# Patient Record
Sex: Male | Born: 1960 | Race: White | Hispanic: No | Marital: Married | Smoking: Former smoker
Health system: Southern US, Community
[De-identification: ages and names within clinical notes are randomized; demographics above are authoritative.]

## PROBLEM LIST (undated history)

## (undated) DIAGNOSIS — I341 Nonrheumatic mitral (valve) prolapse: Secondary | ICD-10-CM

## (undated) DIAGNOSIS — C801 Malignant (primary) neoplasm, unspecified: Secondary | ICD-10-CM

## (undated) DIAGNOSIS — G709 Myoneural disorder, unspecified: Secondary | ICD-10-CM

## (undated) DIAGNOSIS — R52 Pain, unspecified: Secondary | ICD-10-CM

## (undated) DIAGNOSIS — K219 Gastro-esophageal reflux disease without esophagitis: Secondary | ICD-10-CM

## (undated) DIAGNOSIS — M1990 Unspecified osteoarthritis, unspecified site: Secondary | ICD-10-CM

## (undated) HISTORY — DX: Nonrheumatic mitral (valve) prolapse: I34.1

## (undated) HISTORY — PX: WISDOM TOOTH EXTRACTION: SHX21

## (undated) HISTORY — PX: POLYPECTOMY: SHX149

## (undated) HISTORY — DX: Gastro-esophageal reflux disease without esophagitis: K21.9

## (undated) HISTORY — DX: Myoneural disorder, unspecified: G70.9

## (undated) HISTORY — PX: OTHER SURGICAL HISTORY: SHX169

## (undated) HISTORY — DX: Unspecified osteoarthritis, unspecified site: M19.90

## (undated) HISTORY — PX: COLONOSCOPY: SHX174

---

## 1989-08-05 HISTORY — PX: CYST REMOVAL TRUNK: SHX6283

## 2002-01-02 ENCOUNTER — Emergency Department (HOSPITAL_COMMUNITY): Admission: EM | Admit: 2002-01-02 | Discharge: 2002-01-02 | Payer: Self-pay | Admitting: *Deleted

## 2014-02-16 ENCOUNTER — Encounter: Payer: Self-pay | Admitting: Gastroenterology

## 2014-03-22 ENCOUNTER — Ambulatory Visit (AMBULATORY_SURGERY_CENTER): Payer: 59 | Admitting: *Deleted

## 2014-03-22 VITALS — Ht 78.0 in | Wt 235.0 lb

## 2014-03-22 DIAGNOSIS — Z1211 Encounter for screening for malignant neoplasm of colon: Secondary | ICD-10-CM

## 2014-03-22 MED ORDER — MOVIPREP 100 G PO SOLR: ORAL | Status: DC

## 2014-03-22 NOTE — Progress Notes (Signed)
No allergies to eggs or soy. No prior anesthesia.  Pt given Emmi instructions for colonoscopy  No oxygen use  No diet drug use  

## 2014-04-19 ENCOUNTER — Ambulatory Visit (AMBULATORY_SURGERY_CENTER): Payer: 59 | Admitting: Gastroenterology

## 2014-04-19 ENCOUNTER — Encounter: Payer: Self-pay | Admitting: Gastroenterology

## 2014-04-19 VITALS — BP 131/92 | HR 78 | Temp 97.7°F | Resp 21 | Ht 78.0 in | Wt 234.0 lb

## 2014-04-19 DIAGNOSIS — Z1211 Encounter for screening for malignant neoplasm of colon: Secondary | ICD-10-CM

## 2014-04-19 DIAGNOSIS — D126 Benign neoplasm of colon, unspecified: Secondary | ICD-10-CM

## 2014-04-19 DIAGNOSIS — C20 Malignant neoplasm of rectum: Secondary | ICD-10-CM

## 2014-04-19 MED ORDER — SODIUM CHLORIDE 0.9 % IV SOLN: 500.0000 mL | INTRAVENOUS | Status: DC

## 2014-04-19 NOTE — Progress Notes (Signed)
A/ox3 pleased with MAC, report to April RN 

## 2014-04-19 NOTE — Op Note (Signed)
[place]  [address]. [place], [address]   COLONOSCOPY PROCEDURE REPORT  PATIENT: [name] [name], [name] [name]  MR#: [id number] BIRTHDATE: May 06, 1961 , 39  yrs. old GENDER: Male ENDOSCOPIST: Milus Banister, MD REFERRED AL:[id number] Inda Merlin, M.D. PROCEDURE DATE:  04/19/2014 PROCEDURE:   Colonoscopy with snare polypectomy First Screening Colonoscopy - Avg.  risk and is 50 yrs.  old or older Yes.  Prior Negative Screening - Now for repeat screening. N/A  History of Adenoma - Now for follow-up colonoscopy & has been > or = to 3 yrs.  N/A  Polyps Removed Today? Yes. ASA CLASS:   Class II INDICATIONS:average risk screening. MEDICATIONS: MAC sedation, administered by CRNA and propofol (Diprivan) 350mg  IV  DESCRIPTION OF PROCEDURE:   After the risks benefits and alternatives of the procedure were thoroughly explained, informed consent was obtained.  A digital rectal exam revealed no abnormalities of the rectum.   The LB [id number]  endoscope was introduced through the anus and advanced to the cecum, which was identified by both the appendix and ileocecal valve. No adverse events experienced.   The quality of the prep was excellent.  The instrument was then slowly withdrawn as the colon was fully examined.  COLON FINDINGS: Two polyps were found, removed and sent to pathology.  One was sessile, located in ascending segment, 36mm across, removed with cold snare (jar 1).  One was semipedunculated, 1.5cm across, located in proximal rectum, removed with snare/cautery (jar 2).  The examination was otherwise normal. Retroflexed views revealed no abnormalities. The time to cecum=4 minutes 06 seconds.  Withdrawal time=11 minutes 32 seconds.  The scope was withdrawn and the procedure completed. COMPLICATIONS: There were no complications.  ENDOSCOPIC IMPRESSION: Two polyps were found, removed and sent to pathology. The examination was otherwise normal.  RECOMMENDATIONS: If the  polyp(s) removed today are proven to be adenomatous (pre-cancerous) polyps, you will need a colonoscopy in 3 years. Otherwise you should continue to follow colorectal cancer screening guidelines for "routine risk" patients with a colonoscopy in 10 years.  You will receive a letter within 1-2 weeks with the results of your biopsy as well as final recommendations.  Please call my office if you have not received a letter after 3 weeks.   eSigned:  Milus Banister, MD 04/19/2014 8:47 AM

## 2014-04-19 NOTE — Patient Instructions (Signed)
YOU HAD AN ENDOSCOPIC PROCEDURE TODAY AT THE [facility]: Refer to the procedure report that was given to you for any specific questions about what was found during the examination.  If the procedure report does not answer your questions, please call your gastroenterologist to clarify.  If you requested that your care partner not be given the details of your procedure findings, then the procedure report has been included in a sealed envelope for you to review at your convenience later.  YOU SHOULD EXPECT: Some feelings of bloating in the abdomen. Passage of more gas than usual.  Walking can help get rid of the air that was put into your GI tract during the procedure and reduce the bloating. If you had a lower endoscopy (such as a colonoscopy or flexible sigmoidoscopy) you may notice spotting of blood in your stool or on the toilet paper. If you underwent a bowel prep for your procedure, then you may not have a normal bowel movement for a few days.  DIET: Your first meal following the procedure should be a light meal and then it is ok to progress to your normal diet.  A half-sandwich or bowl of soup is an example of a good first meal.  Heavy or fried foods are harder to digest and may make you feel nauseous or bloated.  Likewise meals heavy in dairy and vegetables can cause extra gas to form and this can also increase the bloating.  Drink plenty of fluids but you should avoid alcoholic beverages for 24 hours.  ACTIVITY: Your care partner should take you home directly after the procedure.  You should plan to take it easy, moving slowly for the rest of the day.  You can resume normal activity the day after the procedure however you should NOT DRIVE or use heavy machinery for 24 hours (because of the sedation medicines used during the test).    SYMPTOMS TO REPORT IMMEDIATELY: A gastroenterologist can be reached at any hour.  During normal business hours, 8:30 AM to 5:00 PM Monday through Friday,  call [phone].  After hours and on weekends, please call the GI answering service at [phone] who will take a message and have the physician on call contact you.   Following lower endoscopy (colonoscopy or flexible sigmoidoscopy):  Excessive amounts of blood in the stool  Significant tenderness or worsening of abdominal pains  Swelling of the abdomen that is new, acute  Fever of 100F or higher  FOLLOW UP: If any biopsies were taken you will be contacted by phone or by letter within the next 1-3 weeks.  Call your gastroenterologist if you have not heard about the biopsies in 3 weeks.  Our staff will call the home number listed on your records the next business day following your procedure to check on you and address any questions or concerns that you may have at that time regarding the information given to you following your procedure. This is a courtesy call and so if there is no answer at the home number and we have not heard from you through the emergency physician on call, we will assume that you have returned to your regular daily activities without incident.  SIGNATURES/CONFIDENTIALITY: You and/or your care partner have signed paperwork which will be entered into your electronic medical record.  These signatures attest to the fact that that the information above on your After Visit Summary has been reviewed and is understood.  Full responsibility of the confidentiality of this   discharge information lies with you and/or your care-partner.  Polyps-handout given  Repeat colonoscopy will be determined by pathology   

## 2014-04-20 ENCOUNTER — Telehealth: Payer: Self-pay | Admitting: *Deleted

## 2014-04-20 NOTE — Telephone Encounter (Signed)
  Follow up Call-  Call back number 04/19/2014  Post procedure Call Back phone  # [phone]  Permission to leave phone message Yes     Patient questions:  Do you have a fever, pain , or abdominal swelling? No. Pain Score  0 *  Have you tolerated food without any problems? Yes.    Have you been able to return to your normal activities? Yes.    Do you have any questions about your discharge instructions: Diet   No. Medications  No. Follow up visit  No.  Do you have questions or concerns about your Care? No.  Actions: * If pain score is 4 or above: No action needed, pain <4.

## 2014-04-26 ENCOUNTER — Telehealth: Payer: Self-pay | Admitting: Gastroenterology

## 2014-04-26 DIAGNOSIS — C189 Malignant neoplasm of colon, unspecified: Secondary | ICD-10-CM

## 2014-04-26 NOTE — Telephone Encounter (Signed)
[name] [name], I spoke with Dr. Lyndon [name] about the proximal rectum polyp.  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  I discussed with [name] [name].  He needs 1. Flex sig to tatoo the site (next Wednesday at [facility]) 2. CT scan chest, abd, pelvis with IV and po contrast for colon cancer staging 3. CEA level 4. Referral to [facility] surgery Dr. Leighton [name], I'm sending this man your way, screening exam. Polyp with cancer to the margin of cautery. I located in proximal rectum.  Pending staging above I was not planning on EUS unless you prefer for that to be done. The site was small, if any remains in proximal rectum it is microscopic.

## 2014-04-27 NOTE — Telephone Encounter (Addendum)
You have been scheduled for a CT scan of the chest, abdomen and pelvis at [facility] ([address]---this is in the same building as [facility]).   You are scheduled on 04/29/14 at 1 pm. You should arrive 15 minutes prior to your appointment time for registration. Please follow the written instructions below on the day of your exam:  WARNING: IF YOU ARE ALLERGIC TO IODINE/X-RAY DYE, PLEASE NOTIFY RADIOLOGY IMMEDIATELY AT [phone]! YOU WILL BE GIVEN A 13 HOUR PREMEDICATION PREP.  1) Do not eat or drink anything after 9 am (4 hours prior to your test) 2) You have been given 2 bottles of oral contrast to drink. The solution may taste better if refrigerated, but do NOT add ice or any other liquid to this solution. Shake well before drinking.    Drink 1 bottle of contrast @ 11 am (2 hours prior to your exam)  Drink 1 bottle of contrast @ 12 noon (1 hour prior to your exam)  You may take any medications as prescribed with a small amount of water except for the following: Metformin, Glucophage, Glucovance, Avandamet, Riomet, Fortamet, Actoplus Met, Janumet, Glumetza or Metaglip. The above medications must be held the day of the exam AND 48 hours after the exam.  The purpose of you drinking the oral contrast is to aid in the visualization of your intestinal tract. The contrast solution may cause some diarrhea. Before your exam is started, you will be given a small amount of fluid to drink. Depending on your individual set of symptoms, you may also receive an intravenous injection of x-ray contrast/dye. Plan on being at [facility] for 30 minutes or long, depending on the type of exam you are having performed.  This test typically takes 30-45 minutes to complete.  If you have any questions regarding your exam or if you need to reschedule, you may call the CT department at [phone] between the hours of 8:00 am and 5:00 pm,  Monday-Friday.  ________________________________________________________________________ The pt has been scheduled for Flex on 05/04/14  The pt needs to come in on Thursday to sign paperwork and pick up contrast and instructions as well as have labs. The lab order was entered into EPIC You will be contacted by [facility] with appt date and time.  Referral was sent.  Appt is with Dr Marcello Moores on 05/02/14 955 am arrive at 925 am  Left message on machine to call back

## 2014-04-27 NOTE — Telephone Encounter (Signed)
The pt is aware and will come in tomorrow and get instructions, labs and contrast

## 2014-04-28 ENCOUNTER — Other Ambulatory Visit: Payer: 59

## 2014-04-28 DIAGNOSIS — C189 Malignant neoplasm of colon, unspecified: Secondary | ICD-10-CM

## 2014-04-29 ENCOUNTER — Ambulatory Visit (INDEPENDENT_AMBULATORY_CARE_PROVIDER_SITE_OTHER)
Admission: RE | Admit: 2014-04-29 | Discharge: 2014-04-29 | Disposition: A | Discharge status: Home / Self Care | Payer: 59 | Source: Ambulatory Visit | Attending: Gastroenterology | Admitting: Gastroenterology

## 2014-04-29 DIAGNOSIS — C189 Malignant neoplasm of colon, unspecified: Secondary | ICD-10-CM

## 2014-04-29 LAB — CEA: CEA: 0.6 ng/mL (ref 0.0–5.0)

## 2014-04-29 MED ORDER — IOHEXOL 300 MG/ML  SOLN
100.0000 mL | Freq: Once | INTRAMUSCULAR | Status: AC | PRN
  Administered 2014-04-29: 100 mL via INTRAVENOUS

## 2014-05-02 ENCOUNTER — Other Ambulatory Visit (INDEPENDENT_AMBULATORY_CARE_PROVIDER_SITE_OTHER): Payer: Self-pay | Admitting: General Surgery

## 2014-05-02 NOTE — H&P (Signed)
[name] [name]. [name] [name] 05/02/2014 10:03 AM Location: [facility] Patient #: [id number] DOB: 1961/06/14 Married / Language: [name] [name] / Race: White Male History of Present Illness [name] Ruff MD; 0/98/1191 10:50 AM) Patient words: eval for colon ca.  The patient is a 53 year old male who presents with a complaint of rectal cancer. patient presents to the office with a rectal cancer found in a proximal rectal polyp during screening colonoscopy. Polyp was excised with snare cautery and cancer was noted at the base of the cauterized margin. A CT scan of the ic diseasemen and pelvis show no signs of metastatic disease. CEA was 0.6. Patient has a family history of colon polyps but not colon cancer. He has noticed a small amount of rectal bleeding with irregular bowel habits. he denies any weight loss. Other Problems [name] [name], CMA; 05/02/2014 10:04 AM) Back Pain Gastroesophageal Reflux Disease  Past Surgical History [name] [name], CMA; 05/02/2014 10:04 AM) Colon Polyp Removal - Colonoscopy  Diagnostic Studies History [name] [name], CMA; 05/02/2014 10:04 AM) Colonoscopy within last year  Allergies [name] [name], CMA; 05/02/2014 10:05 AM) Penicillamine *ASSORTED CLASSES*  Medication History ([name] [name], CMA; 05/02/2014 10:06 AM) Ranitidine HCl (150MG  Tablet, Oral daily) Active. Vitamin D (1000UNIT Tablet, Oral two times daily) Active.  Social History [name] [name], [place]; 05/02/2014 10:04 AM) Alcohol use Occasional alcohol use. Caffeine use Coffee. No drug use Tobacco use Never smoker.  Family History [name] [name], [place]; 05/02/2014 10:04 AM) Colon Polyps Mother. Hypertension Father, Mother. Migraine Headache Mother. Thyroid problems Mother.     Review of Systems [name] [name] CMA; 05/02/2014 10:04 AM) General Not Present- Appetite Loss, Chills, Fatigue, Fever, Night Sweats, Weight Gain and Weight Loss. Skin Not Present- Change in Wart/Mole, Dryness, Hives, Jaundice, New  Lesions, Non-Healing Wounds, Rash and Ulcer. HEENT Present- Seasonal Allergies and Wears glasses/contact lenses. Not Present- Earache, Hearing Loss, Hoarseness, [name] Bleed, Oral Ulcers, Ringing in the Ears, Sinus Pain, Sore Throat, Visual Disturbances and Yellow Eyes. Respiratory Not Present- Bloody sputum, Chronic Cough, Difficulty Breathing, Snoring and Wheezing. Breast Not Present- Breast Mass, Breast Pain, Nipple Discharge and Skin Changes. Cardiovascular Not Present- Chest Pain, Difficulty Breathing Lying Down, Leg Cramps, Palpitations, Rapid Heart Rate, Shortness of Breath and Swelling of Extremities. Gastrointestinal Present- Change in Bowel Habits. Not Present- Abdominal Pain, Bloating, Bloody Stool, Chronic diarrhea, Constipation, Difficulty Swallowing, Excessive gas, Gets full quickly at meals, Hemorrhoids, Indigestion, Nausea, Rectal Pain and Vomiting. Male Genitourinary Not Present- Blood in Urine, Change in Urinary Stream, Frequency, Impotence, Nocturia, Painful Urination, Urgency and Urine Leakage. Musculoskeletal Not Present- Back Pain, Joint Pain, Joint Stiffness, Muscle Pain, Muscle Weakness and Swelling of Extremities. Neurological Not Present- Decreased Memory, Fainting, Headaches, Numbness, Seizures, Tingling, Tremor, Trouble walking and Weakness. Psychiatric Not Present- Anxiety, Bipolar, Change in Sleep Pattern, Depression, Fearful and Frequent crying. Endocrine Not Present- Cold Intolerance, Excessive Hunger, Hair Changes, Heat Intolerance, Hot flashes and New Diabetes. Hematology Not Present- Easy Bruising, Excessive bleeding, Gland problems, HIV and Persistent Infections.  Vitals ([name] [name] CMA; 05/02/2014 10:08 AM) 05/02/2014 10:07 AM Weight: 229 lb Height: 78in Body Surface Area: 2.39 m Body Mass Index: 26.46 kg/m Pulse: 100 (Regular)  BP: 136/96 (Sitting, Left Arm, Standard)     Physical Exam [name] Ruff MD; 4/78/2956 11:45 AM)  General Mental  Status-Alert. General Appearance-Cooperative.  Chest and Lung Exam Auscultation Breath sounds - Normal.  Cardiovascular Cardiovascular examination reveals -normal heart sounds, regular rate and rhythm with no murmurs.  Abdomen Palpation/Percussion Palpation and Percussion of the abdomen  reveal - Soft and Non Tender.    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 Cancer was noted down to the cauterized edge. Metastatic workup has been negative. Impression: We had a long discussion today about surgical resection versus endoscopic surveillance. We discussed the risk of endoscopic surveillance in detail. This includes a small risk of lymph node involvement as well as recurrence of cancer. He will undergo reevaluation and tattooing of the lesion on Wednesday. We will also discuss his case and cancer conference on Wednesday morning. Given the information that I have, I have recommended surgical resection with a low anterior resection and primary anastomosis. I believe this would be standard of care in his case. He will discuss this with his wife and Dr. Ardis Hughs again on Wednesday. We will call him in a few days to see if he would like to schedule surgery. The surgery and anatomy were described to the patient as well as the risks of surgery and the possible complications. These include: Bleeding, deep abdominal infections and possible wound complications such as hernia and infection, damage to adjacent structures, leak of surgical connections, which can lead to other surgeries and possibly an ostomy, possible need for other procedures, such as abscess drains in radiology, possible prolonged hospital stay, possible diarrhea from removal of part of the colon, possible constipation from narcotics, possible bowel,  bladder or sexual dysfunction if having rectal surgery, prolonged fatigue/weakness or appetite loss, possible early recurrence of of disease, possible complications of their medical problems such as heart disease or arrhythmias or lung problems, death (less than 1%). I believe the patient understands and wishes to proceed with the surgery.  Current Plans Pt Education - [facility] Bowel Prep Pt Education - [facility] Colorectal Cancer (AT) Started Neomycin Sulfate 500MG , 2 (two) Tablet SEE NOTE, #6, 05/02/2014, No Refill. Local Order: TAKE TWO TABLETS AT 2 PM, 3 PM, AND 10 PM THE DAY PRIOR TO SURGERY Started Flagyl 500MG , 2 (two) Tablet SEE NOTE, #6, 05/02/2014, No Refill. Local Order: Take at 2pm, 3pm, and 10pm the day prior to your colon operation

## 2014-05-04 ENCOUNTER — Encounter: Payer: Self-pay | Admitting: Gastroenterology

## 2014-05-04 ENCOUNTER — Ambulatory Visit (AMBULATORY_SURGERY_CENTER): Payer: 59 | Admitting: Gastroenterology

## 2014-05-04 VITALS — BP 128/93 | HR 72 | Temp 95.4°F | Resp 22 | Ht 78.0 in | Wt 234.0 lb

## 2014-05-04 DIAGNOSIS — D126 Benign neoplasm of colon, unspecified: Secondary | ICD-10-CM

## 2014-05-04 DIAGNOSIS — C189 Malignant neoplasm of colon, unspecified: Secondary | ICD-10-CM

## 2014-05-04 MED ORDER — SODIUM CHLORIDE 0.9 % IV SOLN: 500.0000 mL | INTRAVENOUS | Status: DC

## 2014-05-04 NOTE — Progress Notes (Signed)
Called to room to assist during endoscopic procedure.  Patient ID and intended procedure confirmed with present staff. Received instructions for my participation in the procedure from the performing physician.  

## 2014-05-04 NOTE — Progress Notes (Signed)
Report to PACU, RN, vss, BBS= Clear.  

## 2014-05-04 NOTE — Patient Instructions (Signed)
Discharge instructions given with verbal understanding. Biopsies taken. Resume previous medications. YOU HAD AN ENDOSCOPIC PROCEDURE TODAY AT THE [facility]: Refer to the procedure report that was given to you for any specific questions about what was found during the examination.  If the procedure report does not answer your questions, please call your gastroenterologist to clarify.  If you requested that your care partner not be given the details of your procedure findings, then the procedure report has been included in a sealed envelope for you to review at your convenience later.  YOU SHOULD EXPECT: Some feelings of bloating in the abdomen. Passage of more gas than usual.  Walking can help get rid of the air that was put into your GI tract during the procedure and reduce the bloating. If you had a lower endoscopy (such as a colonoscopy or flexible sigmoidoscopy) you may notice spotting of blood in your stool or on the toilet paper. If you underwent a bowel prep for your procedure, then you may not have a normal bowel movement for a few days.  DIET: Your first meal following the procedure should be a light meal and then it is ok to progress to your normal diet.  A half-sandwich or bowl of soup is an example of a good first meal.  Heavy or fried foods are harder to digest and may make you feel nauseous or bloated.  Likewise meals heavy in dairy and vegetables can cause extra gas to form and this can also increase the bloating.  Drink plenty of fluids but you should avoid alcoholic beverages for 24 hours.  ACTIVITY: Your care partner should take you home directly after the procedure.  You should plan to take it easy, moving slowly for the rest of the day.  You can resume normal activity the day after the procedure however you should NOT DRIVE or use heavy machinery for 24 hours (because of the sedation medicines used during the test).    SYMPTOMS TO REPORT IMMEDIATELY: A gastroenterologist  can be reached at any hour.  During normal business hours, 8:30 AM to 5:00 PM Monday through Friday, call [phone].  After hours and on weekends, please call the GI answering service at [phone] who will take a message and have the physician on call contact you.   Following lower endoscopy (colonoscopy or flexible sigmoidoscopy):  Excessive amounts of blood in the stool  Significant tenderness or worsening of abdominal pains  Swelling of the abdomen that is new, acute  Fever of 100F or higher  FOLLOW UP: If any biopsies were taken you will be contacted by phone or by letter within the next 1-3 weeks.  Call your gastroenterologist if you have not heard about the biopsies in 3 weeks.  Our staff will call the home number listed on your records the next business day following your procedure to check on you and address any questions or concerns that you may have at that time regarding the information given to you following your procedure. This is a courtesy call and so if there is no answer at the home number and we have not heard from you through the emergency physician on call, we will assume that you have returned to your regular daily activities without incident.  SIGNATURES/CONFIDENTIALITY: You and/or your care partner have signed paperwork which will be entered into your electronic medical record.  These signatures attest to the fact that that the information above on your After Visit Summary has been reviewed   and is understood.  Full responsibility of the confidentiality of this discharge information lies with you and/or your care-partner.  

## 2014-05-04 NOTE — Op Note (Signed)
[place]  [address] [place], [address]   FLEX SIGMOIDOSCOPY PROCEDURE REPORT  PATIENT: [name], [name]  MR#: [id number] BIRTHDATE: 04-Dec-1960 , 44  yrs. 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 MEDICATIONS: Monitored anesthesia care and Propofol 200 mg IV  DESCRIPTION OF PROCEDURE:    Physical exam was performed.  Informed consent was obtained from the patient after explaining the benefits, risks, and alternatives to procedure.  The patient was connected to monitor and placed in left lateral position. Continuous oxygen was provided by nasal cannula and IV medicine administered through an indwelling cannula.  After administration of sedation and rectal exam, the patients rectum was intubated and the LB [id number]  colonoscope was advanced under direct visualization to the cecum.  The scope was removed slowly by carefully examining the color, texture, anatomy, and integrity mucosa on the way out.  The patient was recovered in endoscopy and discharged home in satisfactory condition.   COLON FINDINGS: The site of previous "proximal rectum" polyp was located.  This was a 13cm from anal verge (rectosigmoid junction). There was a typical healing ulcer with surrounding slightly edematous mucosa.  The mucosa directly adjacent to the ulcer was biopsied and then 2 submucosal injections of [place] Ink were performed.  The examination was otherwise normal. PREP QUALITY: The overall prep quality was excellent. CECAL W/D TIME: NA  COMPLICATIONS: None  ENDOSCOPIC IMPRESSION: The site of previous "proximal rectum" polyp was located.  This was a 13cm from anal verge (rectosigmoid junction).  There was a typical healing ulcer with surrounding slightly edematous mucosa. The mucosa  directly adjacent to the ulcer was biopsied and then 2 submucosal injections of [place] Ink were performed.  The examination was otherwise normal  RECOMMENDATIONS: Proceed with surgical resection as planned by Dr. Marcello Moores.   _______________________________ eSigned:  Milus Banister, MD 24/23/5361 4:43 AM   cc: Leighton Ruff, MD

## 2014-05-05 ENCOUNTER — Telehealth: Payer: Self-pay | Admitting: *Deleted

## 2014-05-05 NOTE — Telephone Encounter (Signed)
  Follow up Call-  Call back number 05/04/2014 04/19/2014  Post procedure Call Back phone  # [phone]  Permission to leave phone message Yes Yes     Patient questions:  Do you have a fever, pain , or abdominal swelling? No. Pain Score  0 *  Have you tolerated food without any problems? Yes.    Have you been able to return to your normal activities? Yes.    Do you have any questions about your discharge instructions: Diet   No. Medications  No. Follow up visit  No.  Do you have questions or concerns about your Care? No.  Actions: * If pain score is 4 or above: No action needed, pain <4.

## 2014-05-06 ENCOUNTER — Telehealth: Payer: Self-pay | Admitting: Gastroenterology

## 2014-05-06 NOTE — Telephone Encounter (Signed)
I have left message for the patient to call back 

## 2014-05-09 NOTE — Telephone Encounter (Signed)
The pt is aware that Ibuprofen is not a medication that we hold prior to procedures.

## 2014-05-18 ENCOUNTER — Encounter (HOSPITAL_COMMUNITY): Payer: Self-pay | Admitting: Pharmacy Technician

## 2014-05-23 ENCOUNTER — Encounter (HOSPITAL_COMMUNITY)
Admission: RE | Admit: 2014-05-23 | Discharge: 2014-05-23 | Disposition: A | Discharge status: Home / Self Care | Payer: 59 | Source: Ambulatory Visit | Attending: General Surgery | Admitting: General Surgery

## 2014-05-23 ENCOUNTER — Encounter (HOSPITAL_COMMUNITY): Payer: Self-pay

## 2014-05-23 DIAGNOSIS — C20 Malignant neoplasm of rectum: Secondary | ICD-10-CM | POA: Insufficient documentation

## 2014-05-23 DIAGNOSIS — Z01818 Encounter for other preprocedural examination: Secondary | ICD-10-CM | POA: Diagnosis present

## 2014-05-23 HISTORY — DX: Malignant (primary) neoplasm, unspecified: C80.1

## 2014-05-23 HISTORY — DX: Pain, unspecified: R52

## 2014-05-23 LAB — BASIC METABOLIC PANEL
Anion gap: 13 (ref 5–15)
BUN: 10 mg/dL (ref 6–23)
CO2: 24 meq/L (ref 19–32)
Calcium: 9.7 mg/dL (ref 8.4–10.5)
Chloride: 104 mEq/L (ref 96–112)
Creatinine, Ser: 0.78 mg/dL (ref 0.50–1.35)
GFR calc Af Amer: >90 mL/min (ref >90)
GFR calc non Af Amer: >90 mL/min (ref >90)
GLUCOSE: 98 mg/dL (ref 70–99)
Potassium: 3.9 mEq/L (ref 3.7–5.3)
SODIUM: 141 meq/L (ref 137–147)

## 2014-05-23 LAB — CBC
HEMATOCRIT: 42 % (ref 39.0–52.0)
HEMOGLOBIN: 14.7 g/dL (ref 13.0–17.0)
MCH: 30.2 pg (ref 26.0–34.0)
MCHC: 35 g/dL (ref 30.0–36.0)
MCV: 86.4 fL (ref 78.0–100.0)
Platelets: 214 10*3/uL (ref 150–400)
RBC: 4.86 MIL/uL (ref 4.22–5.81)
RDW: 12.2 % (ref 11.5–15.5)
WBC: 6.4 10*3/uL (ref 4.0–10.5)

## 2014-05-23 LAB — HEMOGLOBIN A1C
HEMOGLOBIN A1C: 5.5 % (ref <5.7)
Mean Plasma Glucose: 111 mg/dL (ref <117)

## 2014-05-23 NOTE — Pre-Procedure Instructions (Signed)
PT HAS CHEST CT REPORT IN EPIC FROM 04/29/14. EKG NOT NEEDED PER ANESTHESIOLOGIST'S GUIDELINES.

## 2014-05-23 NOTE — Progress Notes (Signed)
05/23/14 1323  OBSTRUCTIVE SLEEP APNEA  Have you ever been diagnosed with sleep apnea through a sleep study? No  Do you snore loudly (loud enough to be heard through closed doors)?  1  Do you often feel tired, fatigued, or sleepy during the daytime? 0  Has anyone observed you stop breathing during your sleep? 0  Do you have, or are you being treated for high blood pressure? 0  BMI more than 35 kg/m2? 0  Age over 54 years old? 1  Neck circumference greater than 40 cm/16 inches? 1  Gender: 1  Obstructive Sleep Apnea Score 4  Score 4 or greater  Results sent to PCP

## 2014-05-23 NOTE — Patient Instructions (Signed)
Follow your bowel prep instructions from Dr. Florene Route SURGERY IS SCHEDULED AT [facility]  ON:    Friday  10/23  REPORT TO  SHORT STAY CENTER AT:  10:45 AM    DO NOT EAT  ANYTHING AFTER MIDNIGHT THE NIGHT BEFORE YOUR SURGERY.   NO FOOD, NO CHEWING GUM, NO MINTS, NO CANDIES, NO CHEWING TOBACCO. YOU MAY HAVE CLEAR LIQUIDS TO DRINK FROM MIDNIGHT UNTIL 6:45 AM DAY OF YOUR SURGERY - LIKE WATER, COFFEE, GATORADE.  NOTHING TO DRINK AFTER 6:45 AM DAY OF YOUR SURGERY.  PLEASE TAKE THE FOLLOWING MEDICATIONS THE AM OF YOUR SURGERY WITH A FEW SIPS OF WATER:  NO MEDICATIONS TO TAKE   DO NOT BRING VALUABLES, MONEY, CREDIT CARDS.  DO NOT WEAR JEWELRY, MAKE-UP, NAIL POLISH AND NO METAL PINS OR CLIPS IN YOUR HAIR. CONTACT LENS, DENTURES / PARTIALS, GLASSES SHOULD NOT BE WORN TO SURGERY AND IN MOST CASES-HEARING AIDS WILL NEED TO BE REMOVED.  BRING YOUR GLASSES CASE, ANY EQUIPMENT NEEDED FOR YOUR CONTACT LENS. FOR PATIENTS ADMITTED TO THE HOSPITAL--CHECK OUT TIME THE DAY OF DISCHARGE IS 11:00 AM.  ALL INPATIENT ROOMS ARE PRIVATE - WITH BATHROOM, TELEPHONE, TELEVISION AND WIFI INTERNET.    PLEASE BE AWARE THAT YOU MAY NEED ADDITIONAL BLOOD DRAWN DAY OF YOUR SURGERY  _______________________________________________________________________   [facility] - Preparing for Surgery Before surgery, you can play an important role.  Because skin is not sterile, your skin needs to be as free of germs as possible.  You can reduce the number of germs on your skin by washing with CHG (chlorahexidine gluconate) soap before surgery.  CHG is an antiseptic cleaner which kills germs and bonds with the skin to continue killing germs even after washing. Please DO NOT use if you have an allergy to CHG or antibacterial soaps.  If your skin becomes reddened/irritated stop using the CHG and inform your nurse when you arrive at Short Stay. Do not shave (including legs and underarms) for at least 48 hours prior to  the first CHG shower.  You may shave your face/neck. Please follow these instructions carefully:  1.  Shower with CHG Soap the night before surgery and the  morning of Surgery.  2.  If you choose to wash your hair, wash your hair first as usual with your  normal  shampoo.  3.  After you shampoo, rinse your hair and body thoroughly to remove the  shampoo.                           4.  Use CHG as you would any other liquid soap.  You can apply chg directly  to the skin and wash                       Gently with a scrungie or clean washcloth.  5.  Apply the CHG Soap to your body ONLY FROM THE NECK DOWN.   Do not use on face/ open                           Wound or open sores. Avoid contact with eyes, ears mouth and genitals (private parts).                       Wash face,  Genitals (private parts) with your normal soap.  6.  Wash thoroughly, paying special attention to the area where your surgery  will be performed.  7.  Thoroughly rinse your body with warm water from the neck down.  8.  DO NOT shower/wash with your normal soap after using and rinsing off  the CHG Soap.                9.  Pat yourself dry with a clean towel.            10.  Wear clean pajamas.            11.  Place clean sheets on your bed the night of your first shower and do not  sleep with pets. Day of Surgery : Do not apply any lotions/deodorants the morning of surgery.  Please wear clean clothes to the hospital/surgery center.  FAILURE TO FOLLOW THESE INSTRUCTIONS MAY RESULT IN THE CANCELLATION OF YOUR SURGERY PATIENT SIGNATURE_________________________________  NURSE SIGNATURE__________________________________  ________________________________________________________________________

## 2014-05-26 MED ORDER — CLINDAMYCIN PHOSPHATE 900 MG/50ML IV SOLN
900.0000 mg | INTRAVENOUS | Status: AC
  Administered 2014-05-27: 900 mg via INTRAVENOUS

## 2014-05-26 MED ORDER — GENTAMICIN SULFATE 40 MG/ML IJ SOLN
5.0000 mg/kg | INTRAVENOUS | Status: AC
  Administered 2014-05-27: 530 mg via INTRAVENOUS
  Filled 2014-05-26: qty 13.25

## 2014-05-27 ENCOUNTER — Encounter (HOSPITAL_COMMUNITY): Payer: 59 | Admitting: Certified Registered Nurse Anesthetist

## 2014-05-27 ENCOUNTER — Inpatient Hospital Stay (HOSPITAL_COMMUNITY)
Admission: RE | Admit: 2014-05-27 | Discharge: 2014-05-31 | DRG: 334 | Disposition: A | Discharge status: Home / Self Care | Payer: 59 | Source: Ambulatory Visit | Attending: General Surgery | Admitting: General Surgery

## 2014-05-27 ENCOUNTER — Encounter (HOSPITAL_COMMUNITY): Payer: Self-pay | Admitting: General Practice

## 2014-05-27 ENCOUNTER — Inpatient Hospital Stay (HOSPITAL_COMMUNITY): Payer: 59 | Admitting: Certified Registered Nurse Anesthetist

## 2014-05-27 ENCOUNTER — Encounter (HOSPITAL_COMMUNITY)
Admission: RE | Disposition: A | Discharge status: Home / Self Care | Payer: Self-pay | Source: Ambulatory Visit | Attending: General Surgery

## 2014-05-27 DIAGNOSIS — Z01812 Encounter for preprocedural laboratory examination: Secondary | ICD-10-CM | POA: Diagnosis not present

## 2014-05-27 DIAGNOSIS — K219 Gastro-esophageal reflux disease without esophagitis: Secondary | ICD-10-CM | POA: Diagnosis present

## 2014-05-27 DIAGNOSIS — Z8249 Family history of ischemic heart disease and other diseases of the circulatory system: Secondary | ICD-10-CM | POA: Diagnosis not present

## 2014-05-27 DIAGNOSIS — Z6826 Body mass index (BMI) 26.0-26.9, adult: Secondary | ICD-10-CM | POA: Diagnosis not present

## 2014-05-27 DIAGNOSIS — C20 Malignant neoplasm of rectum: Principal | ICD-10-CM | POA: Diagnosis present

## 2014-05-27 DIAGNOSIS — Z8371 Family history of colonic polyps: Secondary | ICD-10-CM | POA: Diagnosis not present

## 2014-05-27 LAB — TYPE AND SCREEN
ABO/RH(D): A NEG
ANTIBODY SCREEN: NEGATIVE

## 2014-05-27 LAB — ABO/RH: ABO/RH(D): A NEG

## 2014-05-27 SURGERY — COLECTOMY, PARTIAL, ROBOT-ASSISTED, LAPAROSCOPIC
Anesthesia: General | Site: Abdomen

## 2014-05-27 MED ORDER — BUPIVACAINE-EPINEPHRINE 0.25% -1:200000 IJ SOLN
INTRAMUSCULAR | Status: DC | PRN
  Administered 2014-05-27: 30 mL

## 2014-05-27 MED ORDER — LIDOCAINE HCL (CARDIAC) 20 MG/ML IV SOLN
INTRAVENOUS | Status: AC
  Filled 2014-05-27: qty 5

## 2014-05-27 MED ORDER — HEPARIN SODIUM (PORCINE) 5000 UNIT/ML IJ SOLN
5000.0000 [IU] | Freq: Once | INTRAMUSCULAR | Status: AC
Start: 2014-05-27 — End: 2014-05-27
  Administered 2014-05-27: 5000 [IU] via SUBCUTANEOUS
  Filled 2014-05-27: qty 1

## 2014-05-27 MED ORDER — MORPHINE SULFATE 2 MG/ML IJ SOLN: 2.0000 mg | INTRAMUSCULAR | Status: DC | PRN

## 2014-05-27 MED ORDER — METOCLOPRAMIDE HCL 5 MG/ML IJ SOLN
INTRAMUSCULAR | Status: AC
Start: 2014-05-27 — End: 2014-05-27
  Filled 2014-05-27: qty 2

## 2014-05-27 MED ORDER — DEXAMETHASONE SODIUM PHOSPHATE 10 MG/ML IJ SOLN
INTRAMUSCULAR | Status: DC | PRN
  Administered 2014-05-27: 10 mg via INTRAVENOUS

## 2014-05-27 MED ORDER — BUPIVACAINE-EPINEPHRINE (PF) 0.25% -1:200000 IJ SOLN
INTRAMUSCULAR | Status: AC
  Filled 2014-05-27: qty 30

## 2014-05-27 MED ORDER — DEXAMETHASONE SODIUM PHOSPHATE 10 MG/ML IJ SOLN
INTRAMUSCULAR | Status: AC
  Filled 2014-05-27: qty 1

## 2014-05-27 MED ORDER — ONDANSETRON HCL 4 MG/2ML IJ SOLN
INTRAMUSCULAR | Status: DC | PRN
  Administered 2014-05-27: 4 mg via INTRAVENOUS

## 2014-05-27 MED ORDER — ALVIMOPAN 12 MG PO CAPS
12.0000 mg | ORAL_CAPSULE | Freq: Two times a day (BID) | ORAL | Status: DC
  Administered 2014-05-28 – 2014-05-30 (×5): 12 mg via ORAL
  Filled 2014-05-27 (×6): qty 1

## 2014-05-27 MED ORDER — ALUM & MAG HYDROXIDE-SIMETH 200-200-20 MG/5ML PO SUSP
30.0000 mL | Freq: Four times a day (QID) | ORAL | Status: DC | PRN
  Administered 2014-05-29 – 2014-05-30 (×2): 30 mL via ORAL
  Filled 2014-05-27 (×2): qty 30

## 2014-05-27 MED ORDER — DEXMEDETOMIDINE HCL IN NACL 200 MCG/50ML IV SOLN
0.3000 ug/kg/h | INTRAVENOUS | Status: DC
  Administered 2014-05-27: 0.3 ug/kg/h via INTRAVENOUS
  Filled 2014-05-27: qty 50

## 2014-05-27 MED ORDER — METOCLOPRAMIDE HCL 5 MG/ML IJ SOLN
INTRAMUSCULAR | Status: DC | PRN
  Administered 2014-05-27: 10 mg via INTRAVENOUS

## 2014-05-27 MED ORDER — PHENYLEPHRINE HCL 10 MG/ML IJ SOLN
INTRAMUSCULAR | Status: DC | PRN
  Administered 2014-05-27: 80 ug via INTRAVENOUS
  Administered 2014-05-27: 40 ug via INTRAVENOUS
  Administered 2014-05-27: 80 ug via INTRAVENOUS

## 2014-05-27 MED ORDER — 0.9 % SODIUM CHLORIDE (POUR BTL) OPTIME
TOPICAL | Status: DC | PRN
  Administered 2014-05-27: 3000 mL

## 2014-05-27 MED ORDER — INDOCYANINE GREEN 25 MG IV SOLR
INTRAVENOUS | Status: DC | PRN
  Administered 2014-05-27: 5 mg via INTRAVENOUS

## 2014-05-27 MED ORDER — SUCCINYLCHOLINE CHLORIDE 20 MG/ML IJ SOLN
INTRAMUSCULAR | Status: DC | PRN
  Administered 2014-05-27: 100 mg via INTRAVENOUS

## 2014-05-27 MED ORDER — ALVIMOPAN 12 MG PO CAPS
12.0000 mg | ORAL_CAPSULE | Freq: Once | ORAL | Status: AC
  Administered 2014-05-27: 12 mg via ORAL
  Filled 2014-05-27: qty 1

## 2014-05-27 MED ORDER — PROPOFOL 10 MG/ML IV BOLUS
INTRAVENOUS | Status: DC | PRN
  Administered 2014-05-27: 200 mg via INTRAVENOUS

## 2014-05-27 MED ORDER — PROPOFOL 10 MG/ML IV BOLUS
INTRAVENOUS | Status: AC
  Filled 2014-05-27: qty 20

## 2014-05-27 MED ORDER — DIPHENHYDRAMINE HCL 50 MG/ML IJ SOLN: 25.0000 mg | Freq: Four times a day (QID) | INTRAMUSCULAR | Status: DC | PRN

## 2014-05-27 MED ORDER — ROCURONIUM BROMIDE 100 MG/10ML IV SOLN
INTRAVENOUS | Status: AC
  Filled 2014-05-27: qty 1

## 2014-05-27 MED ORDER — METOCLOPRAMIDE HCL 5 MG/ML IJ SOLN: 10.0000 mg | Freq: Once | INTRAMUSCULAR | Status: DC

## 2014-05-27 MED ORDER — HYDROMORPHONE HCL 1 MG/ML IJ SOLN
0.2500 mg | INTRAMUSCULAR | Status: DC | PRN
  Administered 2014-05-27 (×4): 0.5 mg via INTRAVENOUS

## 2014-05-27 MED ORDER — ONDANSETRON HCL 4 MG PO TABS: 4.0000 mg | ORAL_TABLET | Freq: Four times a day (QID) | ORAL | Status: DC | PRN

## 2014-05-27 MED ORDER — LACTATED RINGERS IV SOLN
INTRAVENOUS | Status: DC
  Administered 2014-05-27: 15:00:00 via INTRAVENOUS
  Administered 2014-05-27: 1000 mL via INTRAVENOUS
  Administered 2014-05-27: 18:00:00 via INTRAVENOUS

## 2014-05-27 MED ORDER — HYDROMORPHONE HCL 1 MG/ML IJ SOLN
INTRAMUSCULAR | Status: AC
  Filled 2014-05-27: qty 1

## 2014-05-27 MED ORDER — KETOROLAC TROMETHAMINE 15 MG/ML IJ SOLN
15.0000 mg | Freq: Four times a day (QID) | INTRAMUSCULAR | Status: AC
  Administered 2014-05-27 – 2014-05-29 (×8): 15 mg via INTRAVENOUS
  Filled 2014-05-27 (×9): qty 1

## 2014-05-27 MED ORDER — SODIUM CHLORIDE 0.9 % IV SOLN
40.0000 mg | Freq: Once | INTRAVENOUS | Status: AC
  Administered 2014-05-27: 40 mg via INTRAVENOUS
  Filled 2014-05-27 (×2): qty 4

## 2014-05-27 MED ORDER — CLINDAMYCIN PHOSPHATE 900 MG/50ML IV SOLN
900.0000 mg | Freq: Three times a day (TID) | INTRAVENOUS | Status: AC
  Administered 2014-05-27: 900 mg via INTRAVENOUS
  Filled 2014-05-27: qty 50

## 2014-05-27 MED ORDER — FENTANYL CITRATE 0.05 MG/ML IJ SOLN
INTRAMUSCULAR | Status: DC | PRN
  Administered 2014-05-27 (×2): 25 ug via INTRAVENOUS
  Administered 2014-05-27: 100 ug via INTRAVENOUS
  Administered 2014-05-27 (×2): 50 ug via INTRAVENOUS

## 2014-05-27 MED ORDER — CLINDAMYCIN PHOSPHATE 900 MG/50ML IV SOLN
INTRAVENOUS | Status: AC
  Filled 2014-05-27: qty 50

## 2014-05-27 MED ORDER — GLYCOPYRROLATE 0.2 MG/ML IJ SOLN
INTRAMUSCULAR | Status: AC
  Filled 2014-05-27: qty 4

## 2014-05-27 MED ORDER — MIDAZOLAM HCL 5 MG/5ML IJ SOLN
INTRAMUSCULAR | Status: DC | PRN
  Administered 2014-05-27: 2 mg via INTRAVENOUS

## 2014-05-27 MED ORDER — MIDAZOLAM HCL 2 MG/2ML IJ SOLN
INTRAMUSCULAR | Status: AC
  Filled 2014-05-27: qty 2

## 2014-05-27 MED ORDER — ZOLPIDEM TARTRATE 5 MG PO TABS: 5.0000 mg | ORAL_TABLET | Freq: Every evening | ORAL | Status: DC | PRN

## 2014-05-27 MED ORDER — ONDANSETRON HCL 4 MG/2ML IJ SOLN
INTRAMUSCULAR | Status: AC
  Filled 2014-05-27: qty 2

## 2014-05-27 MED ORDER — ONDANSETRON HCL 4 MG/2ML IJ SOLN: 4.0000 mg | Freq: Four times a day (QID) | INTRAMUSCULAR | Status: DC | PRN

## 2014-05-27 MED ORDER — SODIUM CHLORIDE 0.9 % IV SOLN
INTRAVENOUS | Status: DC
  Administered 2014-05-28 – 2014-05-29 (×4): via INTRAVENOUS

## 2014-05-27 MED ORDER — FAMOTIDINE 20 MG PO TABS: 40.0000 mg | ORAL_TABLET | Freq: Once | ORAL | Status: DC

## 2014-05-27 MED ORDER — PROMETHAZINE HCL 25 MG/ML IJ SOLN: 6.2500 mg | INTRAMUSCULAR | Status: DC | PRN

## 2014-05-27 MED ORDER — ACETAMINOPHEN 500 MG PO TABS
1000.0000 mg | ORAL_TABLET | Freq: Four times a day (QID) | ORAL | Status: AC
  Administered 2014-05-27 – 2014-05-28 (×4): 1000 mg via ORAL
  Filled 2014-05-27 (×5): qty 2

## 2014-05-27 MED ORDER — PHENYLEPHRINE 40 MCG/ML (10ML) SYRINGE FOR IV PUSH (FOR BLOOD PRESSURE SUPPORT)
PREFILLED_SYRINGE | INTRAVENOUS | Status: AC
  Filled 2014-05-27: qty 10

## 2014-05-27 MED ORDER — DIPHENHYDRAMINE HCL 25 MG PO CAPS: 25.0000 mg | ORAL_CAPSULE | Freq: Four times a day (QID) | ORAL | Status: DC | PRN

## 2014-05-27 MED ORDER — KETOROLAC TROMETHAMINE 15 MG/ML IJ SOLN
15.0000 mg | Freq: Four times a day (QID) | INTRAMUSCULAR | Status: DC | PRN
  Administered 2014-05-30: 15 mg via INTRAVENOUS
  Filled 2014-05-27: qty 1

## 2014-05-27 MED ORDER — FAMOTIDINE 20 MG PO TABS
20.0000 mg | ORAL_TABLET | Freq: Every day | ORAL | Status: DC
  Administered 2014-05-27 – 2014-05-30 (×4): 20 mg via ORAL
  Filled 2014-05-27 (×5): qty 1

## 2014-05-27 MED ORDER — NEOSTIGMINE METHYLSULFATE 10 MG/10ML IV SOLN
INTRAVENOUS | Status: DC | PRN
  Administered 2014-05-27: 5 mg via INTRAVENOUS

## 2014-05-27 MED ORDER — LACTATED RINGERS IR SOLN
Status: DC | PRN
  Administered 2014-05-27: 1000 mL

## 2014-05-27 MED ORDER — GLYCOPYRROLATE 0.2 MG/ML IJ SOLN
INTRAMUSCULAR | Status: DC | PRN
  Administered 2014-05-27: .8 mg via INTRAVENOUS

## 2014-05-27 MED ORDER — LACTATED RINGERS IV SOLN
INTRAVENOUS | Status: DC
  Administered 2014-05-27: 1000 mL via INTRAVENOUS
  Administered 2014-05-27: 17:00:00 via INTRAVENOUS

## 2014-05-27 MED ORDER — ENOXAPARIN SODIUM 40 MG/0.4ML ~~LOC~~ SOLN
40.0000 mg | SUBCUTANEOUS | Status: DC
  Administered 2014-05-28 – 2014-05-30 (×3): 40 mg via SUBCUTANEOUS
  Filled 2014-05-27 (×5): qty 0.4

## 2014-05-27 MED ORDER — FENTANYL CITRATE 0.05 MG/ML IJ SOLN
INTRAMUSCULAR | Status: AC
Start: 2014-05-27 — End: 2014-05-27
  Filled 2014-05-27: qty 5

## 2014-05-27 MED ORDER — ROCURONIUM BROMIDE 100 MG/10ML IV SOLN
INTRAVENOUS | Status: DC | PRN
  Administered 2014-05-27 (×2): 15 mg via INTRAVENOUS
  Administered 2014-05-27: 30 mg via INTRAVENOUS
  Administered 2014-05-27: 40 mg via INTRAVENOUS
  Administered 2014-05-27: 20 mg via INTRAVENOUS

## 2014-05-27 MED ORDER — NEOSTIGMINE METHYLSULFATE 10 MG/10ML IV SOLN
INTRAVENOUS | Status: AC
  Filled 2014-05-27: qty 1

## 2014-05-27 SURGICAL SUPPLY — 99 items
BLADE EXTENDED COATED 6.5IN (ELECTRODE) ×3 IMPLANT
BLADE SURG SZ11 CARB STEEL (BLADE) ×3 IMPLANT
CANNULA REDUC XI 12-8 STAPL (CANNULA) ×1
CANNULA REDUC XI 12-8MM STAPL (CANNULA) ×1
CANNULA REDUCER 12-8 DVNC XI (CANNULA) ×1 IMPLANT
CELLS DAT CNTRL 66122 CELL SVR (MISCELLANEOUS) IMPLANT
CHLORAPREP W/TINT 26ML (MISCELLANEOUS) ×3 IMPLANT
CLIP LIGATING HEM O LOK PURPLE (MISCELLANEOUS) IMPLANT
CLIP LIGATING HEMOLOK MED (MISCELLANEOUS) IMPLANT
COVER MAYO STAND STRL (DRAPES) ×3 IMPLANT
COVER TIP SHEARS 8 DVNC (MISCELLANEOUS) ×1 IMPLANT
COVER TIP SHEARS 8MM DA VINCI (MISCELLANEOUS) ×2
DECANTER SPIKE VIAL GLASS SM (MISCELLANEOUS) ×3 IMPLANT
DEVICE TROCAR PUNCTURE CLOSURE (ENDOMECHANICALS) ×2 IMPLANT
DRAIN CHANNEL 19F RND (DRAIN) ×2 IMPLANT
DRAPE ARM DVNC X/XI (DISPOSABLE) ×4 IMPLANT
DRAPE COLUMN DVNC XI (DISPOSABLE) ×1 IMPLANT
DRAPE DA VINCI XI ARM (DISPOSABLE) ×8
DRAPE DA VINCI XI COLUMN (DISPOSABLE) ×2
DRAPE SHEET LG 3/4 BI-LAMINATE (DRAPES) ×3 IMPLANT
DRAPE SPLIT 77X100IN (DRAPES) ×2 IMPLANT
DRAPE SURG IRRIG POUCH 19X23 (DRAPES) ×3 IMPLANT
DRAPE WARM FLUID 44X44 (DRAPE) ×3 IMPLANT
DRSG OPSITE POSTOP 4X10 (GAUZE/BANDAGES/DRESSINGS) IMPLANT
DRSG OPSITE POSTOP 4X6 (GAUZE/BANDAGES/DRESSINGS) ×2 IMPLANT
DRSG OPSITE POSTOP 4X8 (GAUZE/BANDAGES/DRESSINGS) IMPLANT
ELECT PENCIL ROCKER SW 15FT (MISCELLANEOUS) ×6 IMPLANT
ELECT REM PT RETURN 15FT ADLT (MISCELLANEOUS) ×3 IMPLANT
ENDOLOOP SUT PDS II  0 18 (SUTURE)
ENDOLOOP SUT PDS II 0 18 (SUTURE) IMPLANT
EVACUATOR DRAINAGE 10X20 100CC (DRAIN) IMPLANT
EVACUATOR SILICONE 100CC (DRAIN) ×3 IMPLANT
GAUZE SPONGE 4X4 12PLY STRL (GAUZE/BANDAGES/DRESSINGS) IMPLANT
GLOVE BIO SURGEON STRL SZ 6.5 (GLOVE) ×6 IMPLANT
GLOVE BIO SURGEONS STRL SZ 6.5 (GLOVE) ×3
GLOVE BIOGEL PI IND STRL 7.0 (GLOVE) ×3 IMPLANT
GLOVE BIOGEL PI INDICATOR 7.0 (GLOVE) ×6
GOWN STRL REUS W/TWL 2XL LVL3 (GOWN DISPOSABLE) ×9 IMPLANT
GOWN STRL REUS W/TWL XL LVL3 (GOWN DISPOSABLE) ×16 IMPLANT
HOLDER FOLEY CATH W/STRAP (MISCELLANEOUS) ×3 IMPLANT
KIT BASIN OR (CUSTOM PROCEDURE TRAY) ×6 IMPLANT
LEGGING LITHOTOMY PAIR STRL (DRAPES) ×3 IMPLANT
NDL INSUFFLATION 14GA 120MM (NEEDLE) ×1 IMPLANT
NEEDLE HYPO 22GX1.5 SAFETY (NEEDLE) ×3 IMPLANT
NEEDLE INSUFFLATION 14GA 120MM (NEEDLE) ×3 IMPLANT
PACK CARDIOVASCULAR III (CUSTOM PROCEDURE TRAY) ×3 IMPLANT
PACK GENERAL/GYN (CUSTOM PROCEDURE TRAY) ×3 IMPLANT
PORT LAP GEL ALEXIS MED 5-9CM (MISCELLANEOUS) IMPLANT
RELOAD STAPLE 45 BLU REG DVNC (STAPLE) IMPLANT
RELOAD STAPLE 45 GRN THCK DVNC (STAPLE) IMPLANT
RETRACTOR WND ALEXIS 18 MED (MISCELLANEOUS) IMPLANT
RTRCTR WOUND ALEXIS 18CM MED (MISCELLANEOUS)
SCISSORS LAP 5X45 EPIX DISP (ENDOMECHANICALS) ×3 IMPLANT
SEAL CANN UNIV 5-8 DVNC XI (MISCELLANEOUS) ×3 IMPLANT
SEAL XI 5MM-8MM UNIVERSAL (MISCELLANEOUS) ×6
SEALER VESSEL DA VINCI XI (MISCELLANEOUS) ×2
SEALER VESSEL EXT DVNC XI (MISCELLANEOUS) ×1 IMPLANT
SET IRRIG TUBING LAPAROSCOPIC (IRRIGATION / IRRIGATOR) ×3 IMPLANT
SLEEVE XCEL OPT CAN 5 100 (ENDOMECHANICALS) IMPLANT
SOLUTION ELECTROLUBE (MISCELLANEOUS) ×3 IMPLANT
SPONGE LAP 18X18 X RAY DECT (DISPOSABLE) IMPLANT
STAPLER 45 BLU RELOAD XI (STAPLE) ×3 IMPLANT
STAPLER 45 BLUE RELOAD XI (STAPLE) ×6
STAPLER 45 GREEN RELOAD XI (STAPLE)
STAPLER 45 GRN RELOAD XI (STAPLE) IMPLANT
STAPLER CANNULA SEAL DVNC XI (STAPLE) IMPLANT
STAPLER CANNULA SEAL XI (STAPLE)
STAPLER CIRC ILS CVD 33MM 37CM (STAPLE) ×2 IMPLANT
STAPLER SHEATH (SHEATH) ×2
STAPLER SHEATH ENDOWRIST DVNC (SHEATH) IMPLANT
STAPLER VISISTAT 35W (STAPLE) ×3 IMPLANT
SUCTION POOLE TIP (SUCTIONS) ×3 IMPLANT
SUT ETHILON 3 0 PS 1 (SUTURE) ×2 IMPLANT
SUT PDS AB 1 CTX 36 (SUTURE) ×4 IMPLANT
SUT PDS AB 1 TP1 96 (SUTURE) IMPLANT
SUT PROLENE 2 0 KS (SUTURE) ×5 IMPLANT
SUT SILK 2 0 (SUTURE) ×3
SUT SILK 2 0 SH CR/8 (SUTURE) ×3 IMPLANT
SUT SILK 2-0 18XBRD TIE 12 (SUTURE) ×1 IMPLANT
SUT SILK 3 0 (SUTURE) ×3
SUT SILK 3 0 SH CR/8 (SUTURE) ×3 IMPLANT
SUT SILK 3-0 18XBRD TIE 12 (SUTURE) ×1 IMPLANT
SUT VIC AB 2-0 SH 18 (SUTURE) ×2 IMPLANT
SUT VIC AB 2-0 SH 27 (SUTURE) ×3
SUT VIC AB 2-0 SH 27X BRD (SUTURE) IMPLANT
SUT VIC AB 4-0 PS2 27 (SUTURE) ×4 IMPLANT
SUT VICRYL 0 UR6 27IN ABS (SUTURE) ×2 IMPLANT
SYR 20CC LL (SYRINGE) ×3 IMPLANT
SYRINGE 10CC LL (SYRINGE) ×3 IMPLANT
SYS LAPSCP GELPORT 120MM (MISCELLANEOUS)
SYSTEM LAPSCP GELPORT 120MM (MISCELLANEOUS) IMPLANT
TOWEL OR 17X26 10 PK STRL BLUE (TOWEL DISPOSABLE) ×6 IMPLANT
TOWEL OR NON WOVEN STRL DISP B (DISPOSABLE) ×3 IMPLANT
TRAY FOLEY CATH 14FRSI W/METER (CATHETERS) ×3 IMPLANT
TROCAR BLADELESS OPT 5 100 (ENDOMECHANICALS) ×3 IMPLANT
TUBING CONNECTING 10 (TUBING) IMPLANT
TUBING CONNECTING 10' (TUBING)
TUBING FILTER THERMOFLATOR (ELECTROSURGICAL) ×3 IMPLANT
YANKAUER SUCT BULB TIP 10FT TU (MISCELLANEOUS) ×3 IMPLANT

## 2014-05-27 NOTE — H&P (View-Only) (Signed)
[name] [name]. Viverette 05/02/2014 10:03 AM Location: [facility] Patient #: [id number] DOB: 21-May-1961 Married / Language: Cleophus Molt / Race: White Male History of Present Illness Leighton Ruff MD; 6/96/2952 10:50 AM) Patient words: eval for colon ca.  The patient is a 53 year old male who presents with a complaint of rectal cancer. patient presents to the office with a rectal cancer found in a proximal rectal polyp during screening colonoscopy. Polyp was excised with snare cautery and cancer was noted at the base of the cauterized margin. A CT scan of the ic diseasemen and pelvis show no signs of metastatic disease. CEA was 0.6. Patient has a family history of colon polyps but not colon cancer. He has noticed a small amount of rectal bleeding with irregular bowel habits. he denies any weight loss. Other Problems Davy Pique Bynum, CMA; 05/02/2014 10:04 AM) Back Pain Gastroesophageal Reflux Disease  Past Surgical History Marjean Donna, CMA; 05/02/2014 10:04 AM) Colon Polyp Removal - Colonoscopy  Diagnostic Studies History Marjean Donna, CMA; 05/02/2014 10:04 AM) Colonoscopy within last year  Allergies Davy Pique Bynum, CMA; 05/02/2014 10:05 AM) Penicillamine *ASSORTED CLASSES*  Medication History (Sonya Bynum, CMA; 05/02/2014 10:06 AM) Ranitidine HCl (150MG  Tablet, Oral daily) Active. Vitamin D (1000UNIT Tablet, Oral two times daily) Active.  Social History Marjean Donna, [place]; 05/02/2014 10:04 AM) Alcohol use Occasional alcohol use. Caffeine use Coffee. No drug use Tobacco use Never smoker.  Family History Marjean Donna, [place]; 05/02/2014 10:04 AM) Colon Polyps Mother. Hypertension Father, Mother. Migraine Headache Mother. Thyroid problems Mother.     Review of Systems Davy Pique Bynum CMA; 05/02/2014 10:04 AM) General Not Present- Appetite Loss, Chills, Fatigue, Fever, Night Sweats, Weight Gain and Weight Loss. Skin Not Present- Change in Wart/Mole, Dryness, Hives, Jaundice, New  Lesions, Non-Healing Wounds, Rash and Ulcer. HEENT Present- Seasonal Allergies and Wears glasses/contact lenses. Not Present- Earache, Hearing Loss, Hoarseness, [name] Bleed, Oral Ulcers, Ringing in the Ears, Sinus Pain, Sore Throat, Visual Disturbances and Yellow Eyes. Respiratory Not Present- Bloody sputum, Chronic Cough, Difficulty Breathing, Snoring and Wheezing. Breast Not Present- Breast Mass, Breast Pain, Nipple Discharge and Skin Changes. Cardiovascular Not Present- Chest Pain, Difficulty Breathing Lying Down, Leg Cramps, Palpitations, Rapid Heart Rate, Shortness of Breath and Swelling of Extremities. Gastrointestinal Present- Change in Bowel Habits. Not Present- Abdominal Pain, Bloating, Bloody Stool, Chronic diarrhea, Constipation, Difficulty Swallowing, Excessive gas, Gets full quickly at meals, Hemorrhoids, Indigestion, Nausea, Rectal Pain and Vomiting. Male Genitourinary Not Present- Blood in Urine, Change in Urinary Stream, Frequency, Impotence, Nocturia, Painful Urination, Urgency and Urine Leakage. Musculoskeletal Not Present- Back Pain, Joint Pain, Joint Stiffness, Muscle Pain, Muscle Weakness and Swelling of Extremities. Neurological Not Present- Decreased Memory, Fainting, Headaches, Numbness, Seizures, Tingling, Tremor, Trouble walking and Weakness. Psychiatric Not Present- Anxiety, Bipolar, Change in Sleep Pattern, Depression, Fearful and Frequent crying. Endocrine Not Present- Cold Intolerance, Excessive Hunger, Hair Changes, Heat Intolerance, Hot flashes and New Diabetes. Hematology Not Present- Easy Bruising, Excessive bleeding, Gland problems, HIV and Persistent Infections.  Vitals (Sonya Bynum CMA; 05/02/2014 10:08 AM) 05/02/2014 10:07 AM Weight: 229 lb Height: 78in Body Surface Area: 2.39 m Body Mass Index: 26.46 kg/m Pulse: 100 (Regular)  BP: 136/96 (Sitting, Left Arm, Standard)     Physical Exam Leighton Ruff MD; 8/41/3244 11:45 AM)  General Mental  Status-Alert. General Appearance-Cooperative.  Chest and Lung Exam Auscultation Breath sounds - Normal.  Cardiovascular Cardiovascular examination reveals -normal heart sounds, regular rate and rhythm with no murmurs.  Abdomen Palpation/Percussion Palpation and Percussion of the abdomen  reveal - Soft and Non Tender.    Assessment & Plan Leighton Ruff MD; 3/74/8270 12:23 PM)  PRIMARY CANCER OF RECTUM (154.1  C20) Story: Jionni Helming is a 53 year old male who presents to the office with rectal cancer in a polyp resected during screening colonoscopy. Cancer was noted down to the cauterized edge. Metastatic workup has been negative. Impression: We had a long discussion today about surgical resection versus endoscopic surveillance. We discussed the risk of endoscopic surveillance in detail. This includes a small risk of lymph node involvement as well as recurrence of cancer. He will undergo reevaluation and tattooing of the lesion on Wednesday. We will also discuss his case and cancer conference on Wednesday morning. Given the information that I have, I have recommended surgical resection with a low anterior resection and primary anastomosis. I believe this would be standard of care in his case. He will discuss this with his wife and Dr. Ardis Hughs again on Wednesday. We will call him in a few days to see if he would like to schedule surgery. The surgery and anatomy were described to the patient as well as the risks of surgery and the possible complications. These include: Bleeding, deep abdominal infections and possible wound complications such as hernia and infection, damage to adjacent structures, leak of surgical connections, which can lead to other surgeries and possibly an ostomy, possible need for other procedures, such as abscess drains in radiology, possible prolonged hospital stay, possible diarrhea from removal of part of the colon, possible constipation from narcotics, possible bowel,  bladder or sexual dysfunction if having rectal surgery, prolonged fatigue/weakness or appetite loss, possible early recurrence of of disease, possible complications of their medical problems such as heart disease or arrhythmias or lung problems, death (less than 1%). I believe the patient understands and wishes to proceed with the surgery.  Current Plans Pt Education - [facility] Bowel Prep Pt Education - [facility] Colorectal Cancer (AT) Started Neomycin Sulfate 500MG , 2 (two) Tablet SEE NOTE, #6, 05/02/2014, No Refill. Local Order: TAKE TWO TABLETS AT 2 PM, 3 PM, AND 10 PM THE DAY PRIOR TO SURGERY Started Flagyl 500MG , 2 (two) Tablet SEE NOTE, #6, 05/02/2014, No Refill. Local Order: Take at 2pm, 3pm, and 10pm the day prior to your colon operation

## 2014-05-27 NOTE — Transfer of Care (Signed)
Immediate Anesthesia Transfer of Care Note  Patient: [name] [name]  Procedure(s) Performed: Procedure(s) (LRB): XI ROBOTIC LAPRASCOPIC LOW ANTERIOR RESECTION/LAR (N/A)  Patient Location: PACU  Anesthesia Type: General  Level of Consciousness: sedated, patient cooperative and responds to stimulation  Airway & Oxygen Therapy: Patient Spontanous Breathing and Patient connected to face mask oxgen  Post-op Assessment: Report given to PACU RN and Post -op Vital signs reviewed and stable  Post vital signs: Reviewed and stable  Complications: No apparent anesthesia complications

## 2014-05-27 NOTE — Interval H&P Note (Signed)
History and Physical Interval Note:  05/27/2014 1:30 PM  [name] [name]  has presented today for surgery, with the diagnosis of rectal cancer  The various methods of treatment have been discussed with the patient and family. After consideration of risks, benefits and other options for treatment, the patient has consented to  Procedure(s): XI ROBOT LOWER ANTERIOR RESECTION/LAR (N/A) as a surgical intervention .  The patient's history has been reviewed, patient examined, no change in status, stable for surgery.  I have reviewed the patient's chart and labs.  Questions were answered to the patient's satisfaction.     Rosario Adie, MD  [facility]

## 2014-05-27 NOTE — Anesthesia Preprocedure Evaluation (Signed)
Anesthesia Evaluation  Patient identified by MRN, date of birth, ID band Patient awake    Reviewed: Allergy & Precautions, H&P , NPO status , Patient's Chart, lab work & pertinent test results  Airway Mallampati: II TM Distance: >3 FB Neck ROM: Full    Dental no notable dental hx.    Pulmonary neg pulmonary ROS, former smoker,  breath sounds clear to auscultation  Pulmonary exam normal       Cardiovascular negative cardio ROS  Rhythm:Regular Rate:Normal     Neuro/Psych negative neurological ROS  negative psych ROS   GI/Hepatic Neg liver ROS, GERD-  Medicated,  Endo/Other  negative endocrine ROS  Renal/GU negative Renal ROS  negative genitourinary   Musculoskeletal negative musculoskeletal ROS (+)   Abdominal   Peds negative pediatric ROS (+)  Hematology negative hematology ROS (+)   Anesthesia Other Findings   Reproductive/Obstetrics negative OB ROS                           Anesthesia Physical Anesthesia Plan  ASA: II  Anesthesia Plan: General   Post-op Pain Management:    Induction: Intravenous  Airway Management Planned: Oral ETT  Additional Equipment:   Intra-op Plan:   Post-operative Plan: Extubation in OR  Informed Consent: I have reviewed the patients History and Physical, chart, labs and discussed the procedure including the risks, benefits and alternatives for the proposed anesthesia with the patient or authorized representative who has indicated his/her understanding and acceptance.   Dental advisory given  Plan Discussed with: CRNA and Surgeon  Anesthesia Plan Comments:         Anesthesia Quick Evaluation

## 2014-05-27 NOTE — Op Note (Signed)
05/27/2014  8:28 PM  PATIENT:  [name] [name]  53 y.o. male  Patient Care Team: Josetta Huddle, MD as PCP - General (Internal Medicine)  PRE-OPERATIVE DIAGNOSIS:  rectal cancer  POST-OPERATIVE DIAGNOSIS:  rectal cancer  PROCEDURE:  XI ROBOTIC LOW ANTERIOR RESECTION    Surgeon(s): Leighton Ruff, MD Ralene Ok, MD  ASSISTANT: Rosendo Gros   ANESTHESIA:   local and general  EBL: 193ml Total I/O In: 350 [I.V.:350] Out: 425 [Urine:275; Drains:150]  Delay start of Pharmacological VTE agent (>24hrs) due to surgical blood loss or risk of bleeding:  no  DRAINS: (69F) Jackson-Pratt drain(s) with closed bulb suction in the pelvis   SPECIMEN:  Source of Specimen:  Rectosigmoid  DISPOSITION OF SPECIMEN:  PATHOLOGY  COUNTS:  YES  PLAN OF CARE: Admit to inpatient   PATIENT DISPOSITION:  PACU - hemodynamically stable.  INDICATION:    This is a 53 y.o. M who presented to my office as a consult from Dr Ardis Hughs.  He was found to have a rectal polyp that was excised.  There was cancer at the base of the the polyp.   I recommended segmental resection:  The anatomy & physiology of the digestive tract was discussed.  The pathophysiology was discussed.  Natural history risks without surgery was discussed.   I worked to give an overview of the disease and the frequent need to have multispecialty involvement.  I feel the risks of no intervention will lead to serious problems that outweigh the operative risks; therefore, I recommended a partial colectomy to remove the pathology.  Laparoscopic & open techniques were discussed.   Risks such as bleeding, infection, abscess, leak, reoperation, possible ostomy, hernia, heart attack, death, and other risks were discussed.  I noted a good likelihood this will help address the problem.   Goals of post-operative recovery were discussed as well.    The patient expressed understanding & wished to proceed with surgery.  OR FINDINGS:   Patient had tattoo  noted at peritoneal reflexion.    No obvious metastatic disease on visceral parietal peritoneum or liver.  The anastomosis rests ~14 cm from the anal verge by rigid proctoscopy.  DESCRIPTION:   Informed consent was confirmed.  The patient underwent general anaesthesia without difficulty.  The patient was positioned appropriately.  VTE prevention in place.  The patient's abdomen was clipped, prepped, & draped in a sterile fashion.  Surgical timeout confirmed our plan.  The patient was positioned in reverse Trendelenburg.  Abdominal entry was gained using Veress needle in the LUQ.  Entry was clean.  I induced carbon dioxide insufflation.  I placed an 64mm robotic port.  Camera inspection revealed no injury.  Extra ports were carefully placed under direct laparoscopic visualization, including a 27mm port in the right lateral abdomen.   The patient was positioned and the robot was docked on the patient's left side.    I reflected the greater omentum and the upper abdomen the small bowel in the upper abdomen. I scored the base of peritoneum of the right side of the mesentery of the left colon from the ligament of Treitz to the peritoneal reflection of the mid rectum.  I elevated the sigmoid mesentery and enetered into the retro-mesenteric plane. We were able to identify the left ureter and gonadal vessels. We kept those posterior within the retroperitoneum and elevated the left colon mesentery off that. I did isolated IMA pedicle but did not ligate it yet.  I continued distally and got into the avascular  plane posterior to the mesorectum. This allowed me to help mobilize the rectum as well by freeing the mesorectum off the sacrum.  I mobilized the peritoneal coverings towards the peritoneal reflection on both the right and left sides of the rectum.  I could see the right and left ureters and stayed away from them.    I skeletonized the inferior mesenteric artery pedicle.  I went down to its takeoff from the  aorta.   After confirming the left ureter was out of the way, I went ahead and ligated the inferior mesenteric artery pedicle with the vessel sealer ~2cm above its takeoff from the aorta.  We ensured hemostasis. I skeletonized the mesorectum at the level of the rectum distal to the tattoo.  This was done using blunt dissection & the vessel sealer. This was transected using 2 blue load robotic stapler fires.  I mobilized the left colon in a lateral to medial fashion off the line of Toldt up towards the splenic flexure to ensure good mobilization of the left colon to reach into the pelvis.   I divided the mesentery up to a portion of normal appearing descending colon. I evaluated perfusion using fire fly. There was good perfusion noted in the descending colon. I made a pfannensteal incision in the lower abdomen and placed the alexis wound protector. The colon was brought out of the abdomen and a purse string device was placed on the previously marked descending colon. A 2-0 prolene suture was used to create the purse string. A 71mm EEA anvil was placed and the purse string was tied. I completed the anastomosis laparoscopically. There was no leak when tested under water with insufflation. The abdomen was irrigated with normal saline. Hemostasis was good. A 19 F blake drain was placed in the pelvis. This was brought out the RUQ and secured with a 2-0 Nylon suture. We switched to clean instruments, gowns, gloves and drapes. The 37mm port site was closed with a 0 Vicryl suture. The peritoneum was closed using a running 2-0 Vicryl sutures, and the fascia was closed using #1 PDS sutures. The subcutaneous tissues were closed using 2-0 Vicryl stitches and the skin of all incisions was closed with 4-0 Vicryl stitch. A sterile dressing was applied. All counts were correct per OR staff. The patient was awakened from anesthesia and sent to the PACU in stable condition.

## 2014-05-28 LAB — CBC
HCT: 37.4 % — ABNORMAL LOW (ref 39.0–52.0)
Hemoglobin: 13.2 g/dL (ref 13.0–17.0)
MCH: 30 pg (ref 26.0–34.0)
MCHC: 35.3 g/dL (ref 30.0–36.0)
MCV: 85 fL (ref 78.0–100.0)
PLATELETS: 208 10*3/uL (ref 150–400)
RBC: 4.4 MIL/uL (ref 4.22–5.81)
RDW: 12.1 % (ref 11.5–15.5)
WBC: 8.3 10*3/uL (ref 4.0–10.5)

## 2014-05-28 LAB — BASIC METABOLIC PANEL
Anion gap: 10 (ref 5–15)
BUN: 10 mg/dL (ref 6–23)
CALCIUM: 9 mg/dL (ref 8.4–10.5)
CO2: 25 mEq/L (ref 19–32)
CREATININE: 0.93 mg/dL (ref 0.50–1.35)
Chloride: 102 mEq/L (ref 96–112)
Glucose, Bld: 144 mg/dL — ABNORMAL HIGH (ref 70–99)
Potassium: 4.5 mEq/L (ref 3.7–5.3)
Sodium: 137 mEq/L (ref 137–147)

## 2014-05-28 MED ORDER — KETOROLAC TROMETHAMINE 15 MG/ML IJ SOLN
15.0000 mg | Freq: Once | INTRAMUSCULAR | Status: AC
  Administered 2014-05-28: 15 mg via INTRAVENOUS
  Filled 2014-05-28: qty 1

## 2014-05-28 MED ORDER — CETYLPYRIDINIUM CHLORIDE 0.05 % MT LIQD
7.0000 mL | Freq: Two times a day (BID) | OROMUCOSAL | Status: DC
  Administered 2014-05-28 – 2014-05-30 (×4): 7 mL via OROMUCOSAL

## 2014-05-28 NOTE — Progress Notes (Signed)
1 Day Post-Op  Subjective: Complains of numbness of right thumb and first 2 fingers. Good motor function. No nausea  Objective: Vital signs in last 24 hours: Temp:  [97.4 F (36.3 C)-98 F (36.7 C)] 98 F (36.7 C) (10/24 0615) Pulse Rate:  [79-101] 101 (10/24 0615) Resp:  [12-20] 18 (10/24 0615) BP: (114-138)/(71-99) 138/90 mmHg (10/24 0615) SpO2:  [99 %-100 %] 100 % (10/24 0615) Weight:  [230 lb (104.327 kg)-242 lb 4.6 oz (109.9 kg)] 242 lb 4.6 oz (109.9 kg) (10/23 2130)    Intake/Output from previous day: 10/23 0701 - 10/24 0700 In: 4410 [I.V.:4410] Out: 2375 [Urine:2050; Drains:225; Blood:100] Intake/Output this shift:    Resp: clear to auscultation bilaterally Cardio: regular rate and rhythm GI: soft, minimal tenderness. good bs. incision looks good  Lab Results:   Recent Labs  05/28/14 0500  WBC 8.3  HGB 13.2  HCT 37.4*  PLT 208   BMET  Recent Labs  05/28/14 0500  NA 137  K 4.5  CL 102  CO2 25  GLUCOSE 144*  BUN 10  CREATININE 0.93  CALCIUM 9.0   PT/INR No results found for this basename: LABPROT, INR,  in the last 72 hours ABG No results found for this basename: PHART, PCO2, PO2, HCO3,  in the last 72 hours  Studies/Results: No results found.  Anti-infectives: Anti-infectives   Start     Dose/Rate Route Frequency Ordered Stop   05/27/14 2200  clindamycin (CLEOCIN) IVPB 900 mg     900 mg 100 mL/hr over 30 Minutes Intravenous 3 times per day 05/27/14 2038 05/27/14 2210   05/26/14 1841  clindamycin (CLEOCIN) IVPB 900 mg     900 mg 100 mL/hr over 30 Minutes Intravenous 60 min pre-op 05/26/14 1841 05/27/14 1426   05/26/14 1841  gentamicin (GARAMYCIN) 530 mg in dextrose 5 % 100 mL IVPB     5 mg/kg  106.1 kg 113.3 mL/hr over 60 Minutes Intravenous 60 min pre-op 05/26/14 1841 05/27/14 1515      Assessment/Plan: [name]/p Procedure([name]): XI ROBOTIC LAPRASCOPIC LOW ANTERIOR RESECTION/LAR (N/A) Advance diet. Start clears today Ambulate D/c foley  tomorrow since he got done late Monitor sensation of right hand. IV right forearm removed  LOS: 1 day    [name] [name],[name] [name] 05/28/2014

## 2014-05-29 LAB — BASIC METABOLIC PANEL
ANION GAP: 10 (ref 5–15)
BUN: 8 mg/dL (ref 6–23)
CALCIUM: 9 mg/dL (ref 8.4–10.5)
CO2: 25 mEq/L (ref 19–32)
CREATININE: 0.82 mg/dL (ref 0.50–1.35)
Chloride: 105 mEq/L (ref 96–112)
GFR calc non Af Amer: >90 mL/min (ref >90)
Glucose, Bld: 100 mg/dL — ABNORMAL HIGH (ref 70–99)
Potassium: 3.9 mEq/L (ref 3.7–5.3)
SODIUM: 140 meq/L (ref 137–147)

## 2014-05-29 LAB — CBC
HCT: 38.1 % — ABNORMAL LOW (ref 39.0–52.0)
Hemoglobin: 13.2 g/dL (ref 13.0–17.0)
MCH: 30 pg (ref 26.0–34.0)
MCHC: 34.6 g/dL (ref 30.0–36.0)
MCV: 86.6 fL (ref 78.0–100.0)
PLATELETS: 199 10*3/uL (ref 150–400)
RBC: 4.4 MIL/uL (ref 4.22–5.81)
RDW: 12.4 % (ref 11.5–15.5)
WBC: 6.7 10*3/uL (ref 4.0–10.5)

## 2014-05-29 MED ORDER — ACETAMINOPHEN 325 MG PO TABS: 650.0000 mg | ORAL_TABLET | Freq: Four times a day (QID) | ORAL | Status: DC | PRN

## 2014-05-29 NOTE — Progress Notes (Signed)
2 Days Post-Op  Subjective: No complaints. Fingers feel better. Passing flatus  Objective: Vital signs in last 24 hours: Temp:  [97.8 F (36.6 C)-98.6 F (37 C)] 98.3 F (36.8 C) (10/25 0545) Pulse Rate:  [83-91] 83 (10/25 0545) Resp:  [16-18] 18 (10/25 0545) BP: (130-154)/(89-102) 152/102 mmHg (10/25 0545) SpO2:  [97 %-100 %] 99 % (10/25 0545)    Intake/Output from previous day: 10/24 0701 - 10/25 0700 In: 2800 [I.V.:2800] Out: 2875 [Urine:2800; Drains:75] Intake/Output this shift: Total I/O In: -  Out: 400 [Urine:400]  Resp: clear to auscultation bilaterally Cardio: regular rate and rhythm GI: soft, minimal tenderness. good bs. incisions look good  Lab Results:   Recent Labs  05/28/14 0500 05/29/14 0605  WBC 8.3 6.7  HGB 13.2 13.2  HCT 37.4* 38.1*  PLT 208 199   BMET  Recent Labs  05/28/14 0500 05/29/14 0605  NA 137 140  K 4.5 3.9  CL 102 105  CO2 25 25  GLUCOSE 144* 100*  BUN 10 8  CREATININE 0.93 0.82  CALCIUM 9.0 9.0   PT/INR No results found for this basename: LABPROT, INR,  in the last 72 hours ABG No results found for this basename: PHART, PCO2, PO2, HCO3,  in the last 72 hours  Studies/Results: No results found.  Anti-infectives: Anti-infectives   Start     Dose/Rate Route Frequency Ordered Stop   05/27/14 2200  clindamycin (CLEOCIN) IVPB 900 mg     900 mg 100 mL/hr over 30 Minutes Intravenous 3 times per day 05/27/14 2038 05/27/14 2210   05/26/14 1841  clindamycin (CLEOCIN) IVPB 900 mg     900 mg 100 mL/hr over 30 Minutes Intravenous 60 min pre-op 05/26/14 1841 05/27/14 1426   05/26/14 1841  gentamicin (GARAMYCIN) 530 mg in dextrose 5 % 100 mL IVPB     5 mg/kg  106.1 kg 113.3 mL/hr over 60 Minutes Intravenous 60 min pre-op 05/26/14 1841 05/27/14 1515      Assessment/Plan: s/p Procedure(s): XI ROBOTIC LAPRASCOPIC LOW ANTERIOR RESECTION/LAR (N/A) Advance diet. Start fulls today ambulate  LOS: 2 days    TOTH III,Kairav Russomanno  S 05/29/2014

## 2014-05-30 LAB — BASIC METABOLIC PANEL
Anion gap: 12 (ref 5–15)
BUN: 8 mg/dL (ref 6–23)
CHLORIDE: 104 meq/L (ref 96–112)
CO2: 25 mEq/L (ref 19–32)
Calcium: 9.4 mg/dL (ref 8.4–10.5)
Creatinine, Ser: 0.78 mg/dL (ref 0.50–1.35)
Glucose, Bld: 107 mg/dL — ABNORMAL HIGH (ref 70–99)
POTASSIUM: 3.7 meq/L (ref 3.7–5.3)
Sodium: 141 mEq/L (ref 137–147)

## 2014-05-30 LAB — CBC
HCT: 38.2 % — ABNORMAL LOW (ref 39.0–52.0)
HEMOGLOBIN: 13.5 g/dL (ref 13.0–17.0)
MCH: 30.1 pg (ref 26.0–34.0)
MCHC: 35.3 g/dL (ref 30.0–36.0)
MCV: 85.3 fL (ref 78.0–100.0)
Platelets: 201 10*3/uL (ref 150–400)
RBC: 4.48 MIL/uL (ref 4.22–5.81)
RDW: 12.1 % (ref 11.5–15.5)
WBC: 6 10*3/uL (ref 4.0–10.5)

## 2014-05-30 MED ORDER — OXYCODONE HCL 5 MG PO TABS: 5.0000 mg | ORAL_TABLET | ORAL | Status: DC | PRN

## 2014-05-30 MED ORDER — IBUPROFEN 200 MG PO TABS
400.0000 mg | ORAL_TABLET | Freq: Four times a day (QID) | ORAL | Status: DC | PRN
  Administered 2014-05-30: 800 mg via ORAL
  Administered 2014-05-30: 600 mg via ORAL
  Administered 2014-05-30 – 2014-05-31 (×2): 800 mg via ORAL
  Filled 2014-05-30: qty 4
  Filled 2014-05-30: qty 3
  Filled 2014-05-30 (×2): qty 4

## 2014-05-30 NOTE — Progress Notes (Signed)
3 Days Post-Op  Subjective: No complaints. Fingers feel better, still has a bit of tingling in tips. Passing flatus.  Ambulating  Objective: Vital signs in last 24 hours: Temp:  [97.7 F (36.5 C)-98.4 F (36.9 C)] 97.7 F (36.5 C) (10/26 0500) Pulse Rate:  [80-82] 80 (10/26 0500) Resp:  [16-18] 16 (10/26 0500) BP: (137-144)/(95-103) 137/95 mmHg (10/26 0500) SpO2:  [98 %-100 %] 98 % (10/26 0500)    Intake/Output from previous day: 10/25 0701 - 10/26 0700 In: 1405 [I.V.:1405] Out: 2255 [Urine:2225; Drains:30] Intake/Output this shift:    Resp: clear to auscultation bilaterally Cardio: regular rate and rhythm GI: soft, minimal tenderness. good bs. incisions look good JP: serous  Lab Results:   Recent Labs  05/29/14 0605 05/30/14 0442  WBC 6.7 6.0  HGB 13.2 13.5  HCT 38.1* 38.2*  PLT 199 201   BMET  Recent Labs  05/29/14 0605 05/30/14 0442  NA 140 141  K 3.9 3.7  CL 105 104  CO2 25 25  GLUCOSE 100* 107*  BUN 8 8  CREATININE 0.82 0.78  CALCIUM 9.0 9.4   PT/INR No results found for this basename: LABPROT, INR,  in the last 72 hours ABG No results found for this basename: PHART, PCO2, PO2, HCO3,  in the last 72 hours  Studies/Results: No results found.  Anti-infectives: Anti-infectives   Start     Dose/Rate Route Frequency Ordered Stop   05/27/14 2200  clindamycin (CLEOCIN) IVPB 900 mg     900 mg 100 mL/hr over 30 Minutes Intravenous 3 times per day 05/27/14 2038 05/27/14 2210   05/26/14 1841  clindamycin (CLEOCIN) IVPB 900 mg     900 mg 100 mL/hr over 30 Minutes Intravenous 60 min pre-op 05/26/14 1841 05/27/14 1426   05/26/14 1841  gentamicin (GARAMYCIN) 530 mg in dextrose 5 % 100 mL IVPB     5 mg/kg  106.1 kg 113.3 mL/hr over 60 Minutes Intravenous 60 min pre-op 05/26/14 1841 05/27/14 1515      Assessment/Plan: s/p Procedure(s): XI ROBOTIC LAPRASCOPIC LOW ANTERIOR RESECTION/LAR (N/A) Advance diet. Start soft diet SL IV Ambulate D/C  JP Poss d/c tom   LOS: 3 days    [name] [name] C. 37/11/8887

## 2014-05-31 MED ORDER — IBUPROFEN 400 MG PO TABS: 400.0000 mg | ORAL_TABLET | Freq: Four times a day (QID) | ORAL | Status: AC | PRN

## 2014-05-31 NOTE — Discharge Instructions (Signed)

## 2014-05-31 NOTE — Discharge Summary (Signed)
Physician Discharge Summary  Patient ID: ALIXANDER RALLIS MRN: [id number] DOB/AGE: [birth year] 53 y.o.  Admit date: 05/27/2014 Discharge date: 05/31/2014  Admission Diagnoses: Rectal cancer  Discharge Diagnoses:  Active Problems:   Rectal cancer   Discharged Condition: good  Hospital Course: Patient was admitted after robotic assisted colectomy for a proximal rectal cancer.  His foley was removed on POD 2.  He began to have bowel function on POD 2 and his diet was advanced.  By POD 4 he was tolerating a diet and having BM's.  He was in stable condition for d/c home.  Consults: None  Significant Diagnostic Studies: labs: cbc, chemistry   Treatments: IV hydration, analgesia: acetaminophen w/ codeine and surgery: see above  Discharge Exam: Blood pressure 135/97, pulse 80, temperature 97.6 F (36.4 C), temperature source Oral, resp. rate 16, height 6\' 6"  (1.981 m), weight 242 lb 4.6 oz (109.9 kg), SpO2 99.00%. General appearance: alert and cooperative GI: normal findings: soft, non-tender Incision/Wound: ecchymosis noted, otherwise clean, dry and intact  Disposition: Final discharge disposition not confirmed     Medication List         ibuprofen 400 MG tablet  Commonly known as:  ADVIL,MOTRIN  Take 1-2 tablets (400-800 mg total) by mouth every 6 (six) hours as needed for mild pain or moderate pain.     Vitamin D 2000 UNITS Caps  Take 1 capsule by mouth every morning.     ZANTAC 150 MG tablet  Generic drug:  ranitidine  Take 150 mg by mouth every evening. As needed.           Follow-up Information   Follow up with Rosario Adie., MD. Schedule an appointment as soon as possible for a visit in 2 weeks.   Specialty:  General Surgery   Contact information:   [address] [place] [address] [phone]       Signed: Rosario Adie 92/42/6834, 7:53 AM

## 2014-05-31 NOTE — Progress Notes (Signed)
Nurse reviewed discharge instructions with pt and his wife.  Pt verbalized understanding of new medications, discharge instructions and follow up appointment.  No concerns at time of discharge.

## 2014-06-01 NOTE — Anesthesia Postprocedure Evaluation (Signed)
  Anesthesia Post-op Note  Patient: [name] [name]  Procedure(s) Performed: Procedure(s) (LRB): XI ROBOTIC LAPRASCOPIC LOW ANTERIOR RESECTION/LAR (N/A)  Patient Location: PACU  Anesthesia Type: General  Level of Consciousness: awake and alert   Airway and Oxygen Therapy: Patient Spontanous Breathing  Post-op Pain: mild  Post-op Assessment: Post-op Vital signs reviewed, Patient's Cardiovascular Status Stable, Respiratory Function Stable, Patent Airway and No signs of Nausea or vomiting  Last Vitals:  Filed Vitals:   05/31/14 0550  BP: 135/97  Pulse: 80  Temp: 36.4 C  Resp: 16    Post-op Vital Signs: stable   Complications: No apparent anesthesia complications

## 2014-09-14 ENCOUNTER — Other Ambulatory Visit: Payer: Self-pay | Admitting: Dermatology

## 2014-10-27 ENCOUNTER — Other Ambulatory Visit: Payer: Self-pay | Admitting: Dermatology

## 2014-11-04 IMAGING — CT CT ABD-PELV W/ CM
2 of 5 series · 15 of 36 positions shown, 18 images · IV contrast (omnipaque)
Comparison: None.

CLINICAL DATA: Colon cancer. Proximal rectal polyp with cancer to
the margin of cautery.

EXAM:
CT CHEST, ABDOMEN, AND PELVIS WITH CONTRAST
TECHNIQUE: Multidetector CT imaging of the chest, abdomen and pelvis was
performed following the standard protocol during bolus
administration of intravenous contrast.
CONTRAST:  100mL OMNIPAQUE IOHEXOL 300 MG/ML  SOLN

[Series 2: cap with · axial · 0.80mm/px · z∈[-631,-31]mm · 12 of 138 slices shown, 15 images]
[im 9/138  mediastinal]
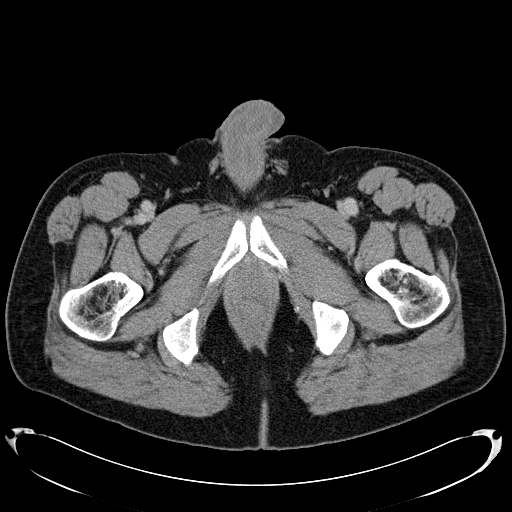
[im 9/138  lung]
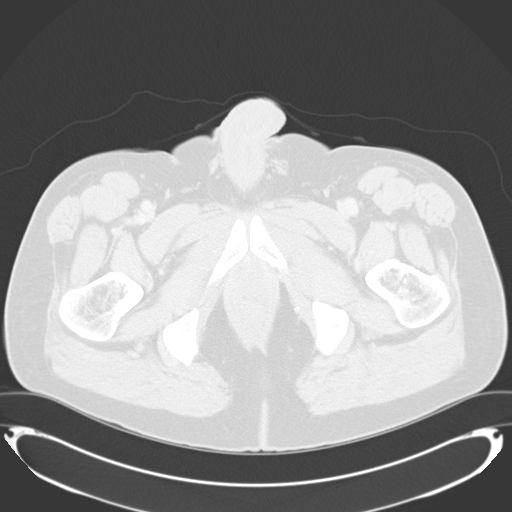
[im 18/138  lung]
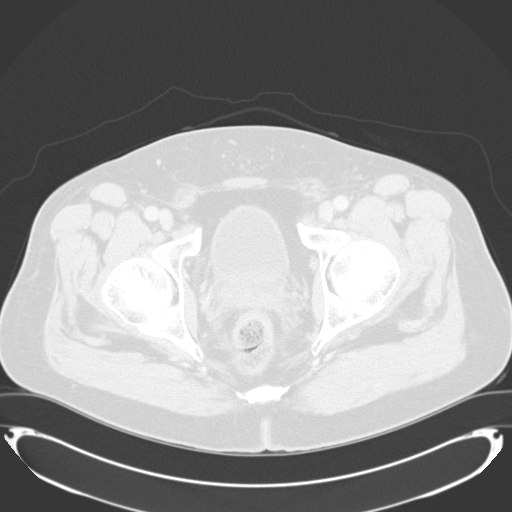
[im 35/138  lung]
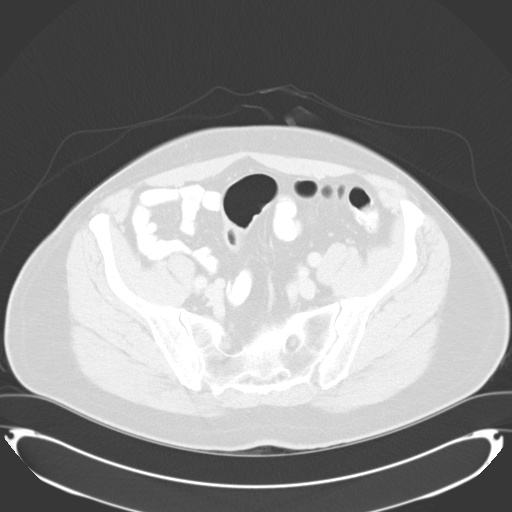
[im 43/138  lung]
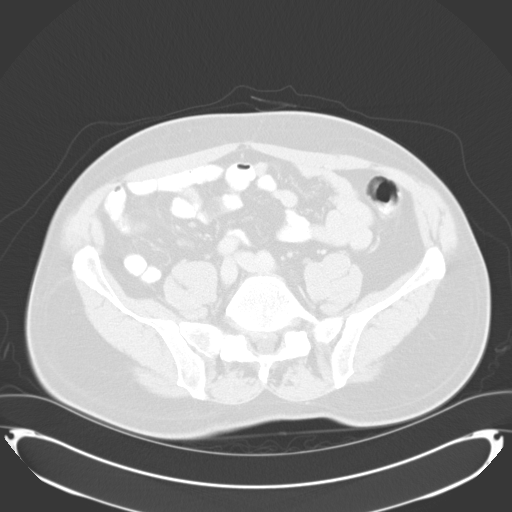
[im 52/138  mediastinal]
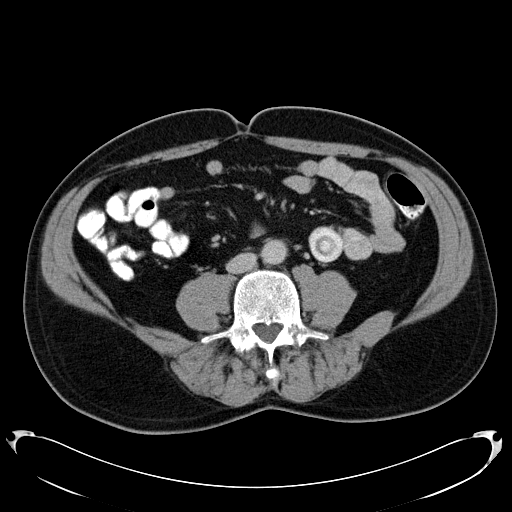
[im 52/138  lung]
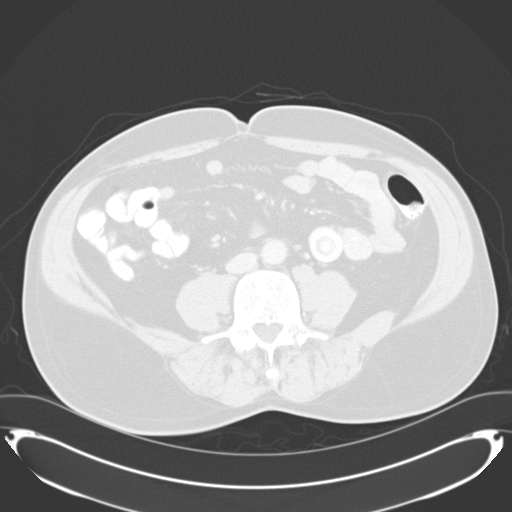
[im 60/138  lung]
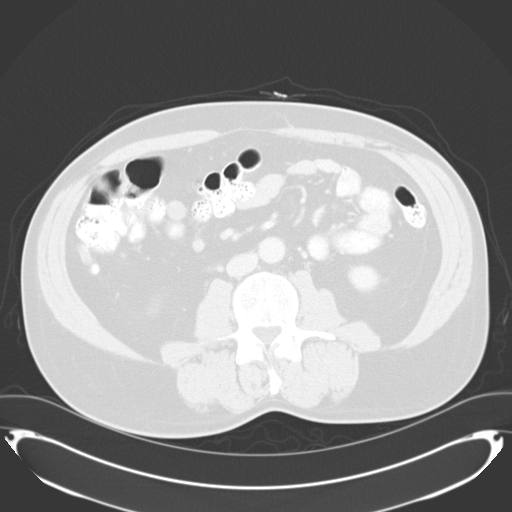
[im 78/138  lung]
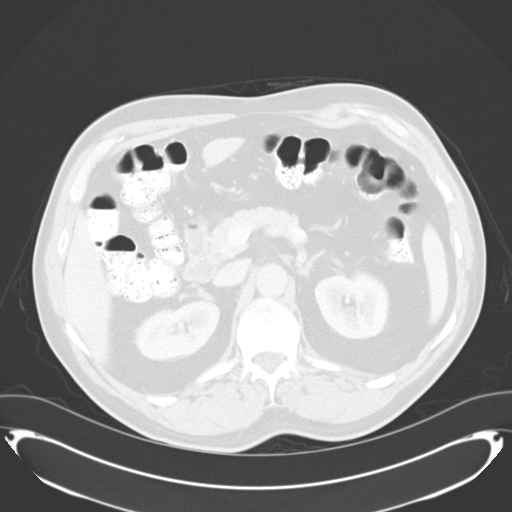
[im 86/138  lung]
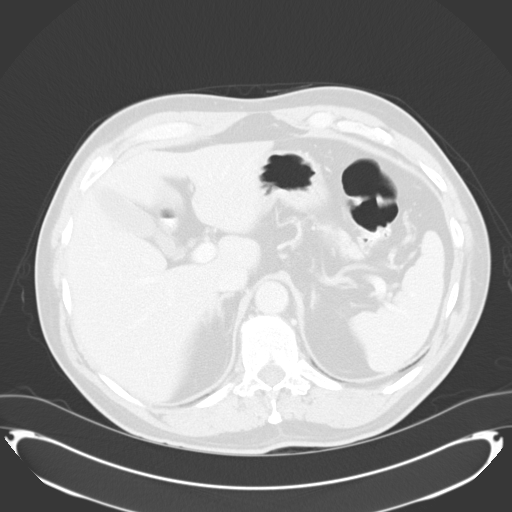
[im 95/138  mediastinal]
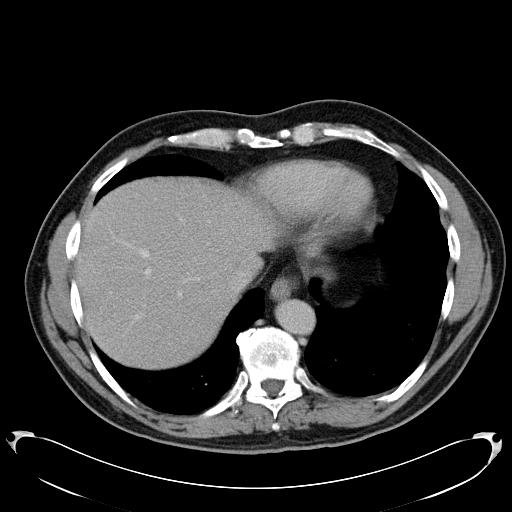
[im 95/138  lung]
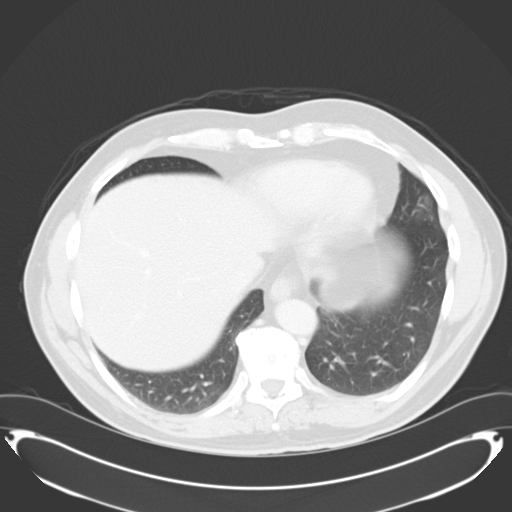
[im 103/138  lung]
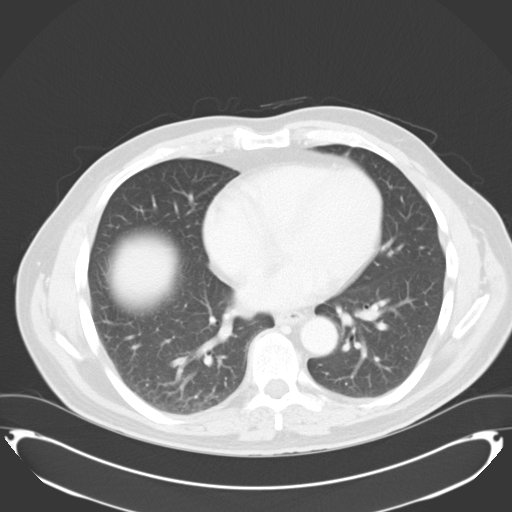
[im 120/138  lung]
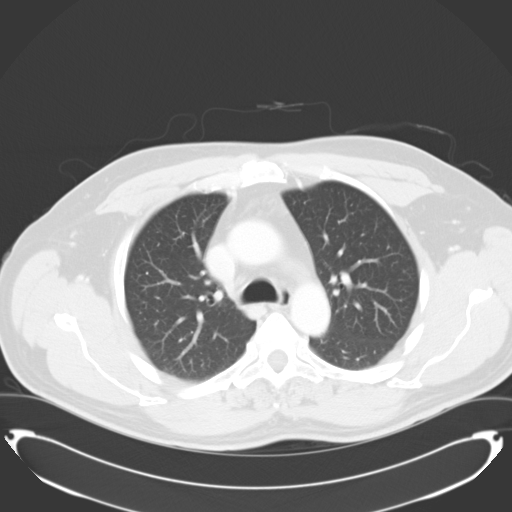
[im 129/138  lung]
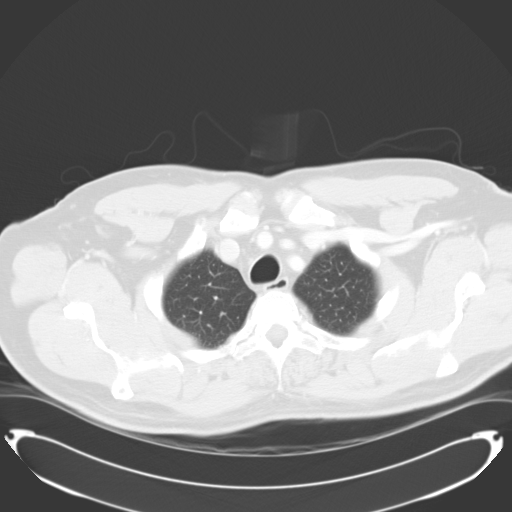

[Series 602: cor · coronal · 1.39mm/px · 3 of 148 slices shown]
[im 30/148  lung]
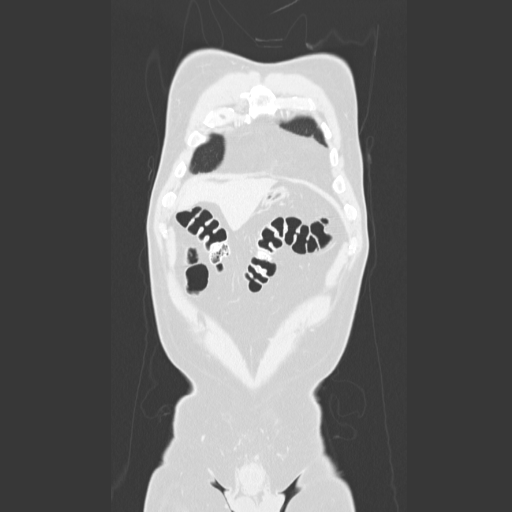
[im 59/148  lung]
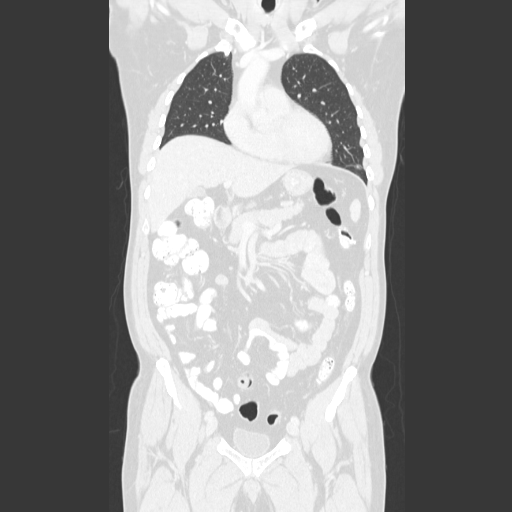
[im 89/148  lung]
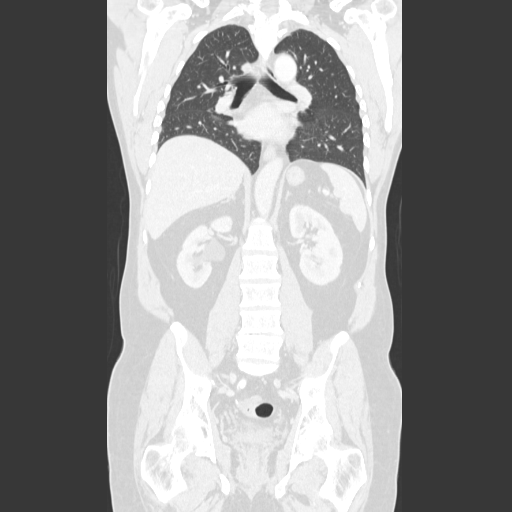

[15 of 36 positions shown; findings below may reference images not displayed]

FINDINGS: CT CHEST FINDINGS

Soft tissue / Mediastinum: There is no axillary lymphadenopathy. No
mediastinal or hilar lymphadenopathy. The heart size is normal. No
pericardial effusion.

Lungs / Pleura: Trachea is unremarkable. Lungs are clear without
focal airspace consolidation. No pulmonary parenchymal nodule or
mass.

Bones: Bone windows reveal no worrisome lytic or sclerotic osseous
lesions.

CT ABDOMEN AND PELVIS FINDINGS

Hepatobiliary: No focal abnormality within the liver parenchyma.

Spleen: No splenomegaly. No focal mass lesion.

Pancreas: No focal mass lesion. No dilatation of the main duct. No
intraparenchymal cyst. No peripancreatic edema.

Stomach/Bowel: Nondistended. No gastric wall thickening. No evidence
of outlet obstruction. Duodenum is normally positioned as is the
ligament of Treitz. There is a 2 cm segment of jejunal enteroenteric
intussusception. No perienteric edema or inflammation. No associated
bowel wall thickening. No small bowel dilatation proximal to the
intussusception. Terminal ileum is normal. The appendix is normal.
No gross colonic mass. No colonic wall thickening. No substantial
diverticular change.

Adrenals/urinary tract: No adrenal nodule or mass. No evidence for
renal mass. Central sinus cyst noted in the left kidney. No
hydronephrosis or hydroureter. Bladder is normal in appearance.

Vascular/Lymphatic: No abdominal aortic aneurysm. Portal vein and
superior mesenteric vein are patent. Splenic vein is patent. No
gastrohepatic or hepatoduodenal ligament lymphadenopathy. No
intraperitoneal or retroperitoneal lymphadenopathy in the abdomen.
No pelvic sidewall lymphadenopathy.

Reproductive: Prostate gland is unremarkable.

Musculoskeletal: No evidence for abdominal wall hernia. Small
sclerotic focus adjacent to the right SI joint is likely benign.

Other: No intraperitoneal free fluid.
IMPRESSION: 1. No evidence for metastatic disease in the chest, abdomen, or
pelvis.
2. Very short segment of enteroenteric intussusception, most likely
transient.

## 2015-05-26 ENCOUNTER — Encounter: Payer: Self-pay | Admitting: Gastroenterology

## 2015-06-06 ENCOUNTER — Encounter: Payer: Self-pay | Admitting: Gastroenterology

## 2015-08-01 ENCOUNTER — Ambulatory Visit (AMBULATORY_SURGERY_CENTER): Payer: Self-pay | Admitting: *Deleted

## 2015-08-01 VITALS — Ht 78.0 in | Wt 226.0 lb

## 2015-08-01 DIAGNOSIS — Z86010 Personal history of colonic polyps: Secondary | ICD-10-CM

## 2015-08-01 MED ORDER — NA SULFATE-K SULFATE-MG SULF 17.5-3.13-1.6 GM/177ML PO SOLN: 1.0000 | Freq: Once | ORAL | Status: DC

## 2015-08-01 NOTE — Progress Notes (Signed)
No egg or soy allergy known to patient  No issues with past sedation with any surgeries  or procedures, no intubation problems --narcotics cause nausea  No diet pills per patient  No home 02 use per patient

## 2015-08-15 ENCOUNTER — Encounter: Payer: Self-pay | Admitting: Gastroenterology

## 2015-08-15 ENCOUNTER — Ambulatory Visit (AMBULATORY_SURGERY_CENTER): Payer: 59 | Admitting: Gastroenterology

## 2015-08-15 VITALS — BP 120/76 | HR 94 | Temp 97.3°F | Resp 16 | Ht 78.0 in | Wt 226.0 lb

## 2015-08-15 DIAGNOSIS — D120 Benign neoplasm of cecum: Secondary | ICD-10-CM | POA: Diagnosis not present

## 2015-08-15 DIAGNOSIS — Z86010 Personal history of colonic polyps: Secondary | ICD-10-CM | POA: Diagnosis present

## 2015-08-15 MED ORDER — SODIUM CHLORIDE 0.9 % IV SOLN: 500.0000 mL | INTRAVENOUS | Status: DC

## 2015-08-15 NOTE — Progress Notes (Signed)
Stable to RR 

## 2015-08-15 NOTE — Op Note (Signed)
[facility]  [address] [place], [address]   COLONOSCOPY PROCEDURE REPORT  PATIENT: Burdette, [place]  MR#: [id number] BIRTHDATE: [birth year] , 4  yrs. old GENDER: male ENDOSCOPIST: Milus Banister, MD PROCEDURE DATE:  08/15/2015 PROCEDURE:   Colonoscopy, surveillance and Colonoscopy with snare polypectomy First Screening Colonoscopy - Avg.  risk and is 50 yrs.  old or older - No.  Prior Negative Screening - Now for repeat screening. N/A  History of Adenoma - Now for follow-up colonoscopy & has been > or = to 3 yrs.  No.  It has been less than 3 yrs since last colonoscopy.  Medical reason.  Polyps removed today? Yes ASA CLASS:   Class II INDICATIONS:Surveillance due to prior colonic neoplasia, PH Colon or Rectal Adenocarcinoma, and proximal rectum adenocarcinoma (Stage I), s/p LAR Dr.  Leighton Ruff [id number]. MEDICATIONS: Monitored anesthesia care, Propofol 300 mg IV, and lidocaine 40mg  IV  DESCRIPTION OF PROCEDURE:   After the risks benefits and alternatives of the procedure were thoroughly explained, informed consent was obtained.  The digital rectal exam revealed no abnormalities of the rectum.   The LB [id number]  endoscope was introduced through the anus and advanced to the cecum, which was identified by both the appendix and ileocecal valve. No adverse events experienced.   The quality of the prep was excellent.  The instrument was then slowly withdrawn as the colon was fully examined. Estimated blood loss is zero unless otherwise noted in this procedure report.   COLON FINDINGS: A sessile polyp measuring 4 mm in size was found at the cecum.  A polypectomy was performed with a cold snare.  The resection was complete, the polyp tissue was completely retrieved and sent to histology.   Site of 2015 LAR anastomosis was clearly located in proximal rectum, appeared normal (no recurrent neoplasm, no edema, no stricturing).   The examination was otherwise  normal. Retroflexed views revealed no abnormalities. The time to cecum = 2.8 Withdrawal time = 8.4   The scope was withdrawn and the procedure completed. COMPLICATIONS: There were no immediate complications.  ENDOSCOPIC IMPRESSION: 1.  Sessile polyp was found at the cecum; polypectomy was performed with a cold snare 2.  Site of 2015 LAR anastomosis was clearly located in proximal rectum, appeared normal (no recurrent neoplasm, no edema, no stricturing) 3.   The examination was otherwise normal  RECOMMENDATIONS: Given your personal history of colorectal cancer you will need a repeat colonoscopy in 3 years (even if the polyp removed today is NOT precancerous). You will receive a letter within 1-2 weeks with the results of your biopsy as well as final recommendations. Please call my office if you have not received a letter after 3 weeks.  eSigned:  Milus Banister, MD 08/15/2015 [id number] AM   cc: Leighton Ruff, MD

## 2015-08-15 NOTE — Progress Notes (Signed)
Called to room to assist during endoscopic procedure.  Patient ID and intended procedure confirmed with present staff. Received instructions for my participation in the procedure from the performing physician.  

## 2015-08-15 NOTE — Patient Instructions (Addendum)
YOU HAD AN ENDOSCOPIC PROCEDURE TODAY AT [facility]:   Refer to the procedure report that was given to you for any specific questions about what was found during the examination.  If the procedure report does not answer your questions, please call your gastroenterologist to clarify.  If you requested that your care partner not be given the details of your procedure findings, then the procedure report has been included in a sealed envelope for you to review at your convenience later.  YOU SHOULD EXPECT: Some feelings of bloating in the abdomen. Passage of more gas than usual.  Walking can help get rid of the air that was put into your GI tract during the procedure and reduce the bloating. If you had a lower endoscopy (such as a colonoscopy or flexible sigmoidoscopy) you may notice spotting of blood in your stool or on the toilet paper. If you underwent a bowel prep for your procedure, you may not have a normal bowel movement for a few days.  Please Note:  You might notice some irritation and congestion in your nose or some drainage.  This is from the oxygen used during your procedure.  There is no need for concern and it should clear up in a day or so.  SYMPTOMS TO REPORT IMMEDIATELY:    Following upper endoscopy (EGD)  Vomiting of blood or coffee ground material  New chest pain or pain under the shoulder blades  Painful or persistently difficult swallowing  New shortness of breath  Fever of 100F or higher  Black, tarry-looking stools  For urgent or emergent issues, a gastroenterologist can be reached at any hour by calling [phone].   DIET: Your first meal following the procedure should be a small meal and then it is ok to progress to your normal diet. Heavy or fried foods are harder to digest and may make you feel nauseous or bloated.  Likewise, meals heavy in dairy and vegetables can increase bloating.  Drink plenty of fluids but you should avoid alcoholic beverages  for 24 hours.  ACTIVITY:  You should plan to take it easy for the rest of today and you should NOT DRIVE or use heavy machinery until tomorrow (because of the sedation medicines used during the test).    FOLLOW UP: Our staff will call the number listed on your records the next business day following your procedure to check on you and address any questions or concerns that you may have regarding the information given to you following your procedure. If we do not reach you, we will leave a message.  However, if you are feeling well and you are not experiencing any problems, there is no need to return our call.  We will assume that you have returned to your regular daily activities without incident.  If any biopsies were taken you will be contacted by phone or by letter within the next 1-3 weeks.  Please call us at [phone] if you have not heard about the biopsies in 3 weeks.    SIGNATURES/CONFIDENTIALITY: You and/or your care partner have signed paperwork which will be entered into your electronic medical record.  These signatures attest to the fact that that the information above on your After Visit Summary has been reviewed and is understood.  Full responsibility of the confidentiality of this discharge information lies with you and/or your care-partner.  Repeat colonoscopy in 3 years Await pathology reportYOU HAD AN ENDOSCOPIC PROCEDURE TODAY AT [facility]:  Refer to the procedure report that was given to you for any specific questions about what was found during the examination.  If the procedure report does not answer your questions, please call your gastroenterologist to clarify.  If you requested that your care partner not be given the details of your procedure findings, then the procedure report has been included in a sealed envelope for you to review at your convenience later.  YOU SHOULD EXPECT: Some feelings of bloating in the abdomen. Passage of more gas than usual.   Walking can help get rid of the air that was put into your GI tract during the procedure and reduce the bloating. If you had a lower endoscopy (such as a colonoscopy or flexible sigmoidoscopy) you may notice spotting of blood in your stool or on the toilet paper. If you underwent a bowel prep for your procedure, you may not have a normal bowel movement for a few days.  Please Note:  You might notice some irritation and congestion in your nose or some drainage.  This is from the oxygen used during your procedure.  There is no need for concern and it should clear up in a day or so.  SYMPTOMS TO REPORT IMMEDIATELY:   Following lower endoscopy (colonoscopy or flexible sigmoidoscopy):  Excessive amounts of blood in the stool  Significant tenderness or worsening of abdominal pains  Swelling of the abdomen that is new, acute  Fever of 100F or higher   For urgent or emergent issues, a gastroenterologist can be reached at any hour by calling [phone].   DIET: Your first meal following the procedure should be a small meal and then it is ok to progress to your normal diet. Heavy or fried foods are harder to digest and may make you feel nauseous or bloated.  Likewise, meals heavy in dairy and vegetables can increase bloating.  Drink plenty of fluids but you should avoid alcoholic beverages for 24 hours.  ACTIVITY:  You should plan to take it easy for the rest of today and you should NOT DRIVE or use heavy machinery until tomorrow (because of the sedation medicines used during the test).    FOLLOW UP: Our staff will call the number listed on your records the next business day following your procedure to check on you and address any questions or concerns that you may have regarding the information given to you following your procedure. If we do not reach you, we will leave a message.  However, if you are feeling well and you are not experiencing any problems, there is no need to return our call.  We  will assume that you have returned to your regular daily activities without incident.  If any biopsies were taken you will be contacted by phone or by letter within the next 1-3 weeks.  Please call us at [phone] if you have not heard about the biopsies in 3 weeks.    SIGNATURES/CONFIDENTIALITY: You and/or your care partner have signed paperwork which will be entered into your electronic medical record.  These signatures attest to the fact that that the information above on your After Visit Summary has been reviewed and is understood.  Full responsibility of the confidentiality of this discharge information lies with you and/or your care-partner.

## 2015-08-16 ENCOUNTER — Telehealth: Payer: Self-pay | Admitting: *Deleted

## 2015-08-16 NOTE — Telephone Encounter (Signed)
  Follow up Call-  Call back number 08/15/2015 05/04/2014 04/19/2014  Post procedure Call Back phone  # [phone]  Permission to leave phone message Yes Yes Yes     Patient questions:  Mailbox is full.

## 2015-08-23 ENCOUNTER — Encounter: Payer: Self-pay | Admitting: Gastroenterology

## 2015-09-14 ENCOUNTER — Other Ambulatory Visit: Payer: Self-pay | Admitting: Dermatology

## 2016-12-25 ENCOUNTER — Other Ambulatory Visit: Payer: Self-pay | Admitting: Dermatology

## 2018-07-08 ENCOUNTER — Encounter: Payer: Self-pay | Admitting: Gastroenterology

## 2018-08-18 ENCOUNTER — Encounter: Payer: Self-pay | Admitting: Gastroenterology

## 2018-08-18 ENCOUNTER — Ambulatory Visit (AMBULATORY_SURGERY_CENTER): Payer: Self-pay | Admitting: *Deleted

## 2018-08-18 VITALS — Ht 78.0 in | Wt 225.0 lb

## 2018-08-18 DIAGNOSIS — Z86010 Personal history of colonic polyps: Secondary | ICD-10-CM

## 2018-08-18 DIAGNOSIS — Z85048 Personal history of other malignant neoplasm of rectum, rectosigmoid junction, and anus: Secondary | ICD-10-CM

## 2018-08-18 MED ORDER — PEG 3350-KCL-NA BICARB-NACL 420 G PO SOLR: 4000.0000 mL | Freq: Once | ORAL | 0 refills | Status: AC

## 2018-08-18 NOTE — Progress Notes (Signed)
Pt states his digestive system is different since the colon resection. He is curious if there is anything he can do to make the system more regular and needs advice   No egg or soy allergy known to patient  No issues with past sedation with any surgeries  or procedures, no intubation problems  No diet pills per patient No home 02 use per patient  No blood thinners per patient  Pt denies issues with constipation - more diarrhea or looser less firm stools- no absolute end to stools  No A fib or A flutter  EMMI video sent to pt's e mail  -- pt declined

## 2018-09-01 ENCOUNTER — Ambulatory Visit (AMBULATORY_SURGERY_CENTER): Payer: 59 | Admitting: Gastroenterology

## 2018-09-01 ENCOUNTER — Encounter: Payer: Self-pay | Admitting: Gastroenterology

## 2018-09-01 VITALS — BP 118/88 | HR 70 | Temp 98.7°F | Resp 13 | Ht 78.0 in | Wt 225.0 lb

## 2018-09-01 DIAGNOSIS — T185XXA Foreign body in anus and rectum, initial encounter: Secondary | ICD-10-CM

## 2018-09-01 DIAGNOSIS — Z85048 Personal history of other malignant neoplasm of rectum, rectosigmoid junction, and anus: Secondary | ICD-10-CM

## 2018-09-01 MED ORDER — SODIUM CHLORIDE 0.9 % IV SOLN: 500.0000 mL | Freq: Once | INTRAVENOUS | Status: DC

## 2018-09-01 NOTE — Progress Notes (Signed)
Report given to PACU, vss 

## 2018-09-01 NOTE — Op Note (Addendum)
[facility] Patient Name: [name] [name] Procedure Date: 09/01/2018 7:24 AM MRN: [id number] Endoscopist: Milus Banister , MD Age: 58 Referring MD:  Date of Birth: [birth year] Gender: Male Account #: [id number] Procedure:                Colonoscopy Indications:              High risk colon cancer surveillance: Personal                            history of colon cancer; StageI rectal                            adenocarcinoma, s/p LAR Dr. Leighton Ruff 40/1027.                            Colonoscopy 2016 single subCM SSA. Medicines:                Monitored Anesthesia Care Procedure:                Pre-Anesthesia Assessment:                           - Prior to the procedure, a History and Physical                            was performed, and patient medications and                            allergies were reviewed. The patient's tolerance of                            previous anesthesia was also reviewed. The risks                            and benefits of the procedure and the sedation                            options and risks were discussed with the patient.                            All questions were answered, and informed consent                            was obtained. Prior Anticoagulants: The patient has                            taken no previous anticoagulant or antiplatelet                            agents. ASA Grade Assessment: II - A patient with                            mild systemic disease. After reviewing the risks  and benefits, the patient was deemed in                            satisfactory condition to undergo the procedure.                           After obtaining informed consent, the colonoscope                            was passed under direct vision. Throughout the                            procedure, the patient's blood pressure, pulse, and                            oxygen saturations were monitored  continuously. The                            Colonoscope was introduced through the anus and                            advanced to the the cecum, identified by                            appendiceal orifice and ileocecal valve. The                            colonoscopy was performed without difficulty. The                            patient tolerated the procedure well. The quality                            of the bowel preparation was good. The ileocecal                            valve, appendiceal orifice, and rectum were                            photographed. Scope In: 8:00:47 AM Scope Out: 8:24:23 AM Scope Withdrawal Time: 0 hours 20 minutes 17 seconds  Total Procedure Duration: 0 hours 23 minutes 36 seconds  Findings:                 The colo-colonic end to end LAR anastomosis was                            noted in the mid rectum. It was normal except for a                            visible suture with associated minor oozing of                            blood at the site. Removal of a suture was easily  accomplished with a regular forceps.                           The exam was otherwise without abnormality on                            direct and retroflexion views. Complications:            No immediate complications. Estimated blood loss:                            None. Estimated Blood Loss:     Estimated blood loss: none. Impression:               - Visible suture at the otherewise normal LAR                            anastomosis, there was associated minor bleeding                            and so I elected to remove the stitch with forceps.                           - No polyps or cancers. Recommendation:           - Patient has a contact number available for                            emergencies. The signs and symptoms of potential                            delayed complications were discussed with the                             patient. Return to normal activities tomorrow.                            Written discharge instructions were provided to the                            patient.                           - Resume previous diet.                           - Continue present medications. Please try once                            daily OTC fiber supplements (orange citrucel) to                            help bulk your stools.                           - Repeat colonoscopy in 5 years for surveillance. Milus Banister, MD 09/01/2018 8:35:16  AM This report has been signed electronically.

## 2018-09-01 NOTE — Patient Instructions (Signed)
  Thank you for allowing [place] to care for you today!  Recommend next surveillance colonoscopy in 5 years.  Resume previous diet and medications today.  Return to normal activities tomorrow.     YOU HAD AN ENDOSCOPIC PROCEDURE TODAY AT [facility]:   Refer to the procedure report that was given to you for any specific questions about what was found during the examination.  If the procedure report does not answer your questions, please call your gastroenterologist to clarify.  If you requested that your care partner not be given the details of your procedure findings, then the procedure report has been included in a sealed envelope for you to review at your convenience later.  YOU SHOULD EXPECT: Some feelings of bloating in the abdomen. Passage of more gas than usual.  Walking can help get rid of the air that was put into your GI tract during the procedure and reduce the bloating. If you had a lower endoscopy (such as a colonoscopy or flexible sigmoidoscopy) you may notice spotting of blood in your stool or on the toilet paper. If you underwent a bowel prep for your procedure, you may not have a normal bowel movement for a few days.  Please Note:  You might notice some irritation and congestion in your nose or some drainage.  This is from the oxygen used during your procedure.  There is no need for concern and it should clear up in a day or so.  SYMPTOMS TO REPORT IMMEDIATELY:   Following lower endoscopy (colonoscopy or flexible sigmoidoscopy):  Excessive amounts of blood in the stool  Significant tenderness or worsening of abdominal pains  Swelling of the abdomen that is new, acute  Fever of 100F or higher   For urgent or emergent issues, a gastroenterologist can be reached at any hour by calling [phone].   DIET:  We do recommend a small meal at first, but then you may proceed to your regular diet.  Drink plenty of fluids but you should avoid alcoholic beverages for  24 hours.  ACTIVITY:  You should plan to take it easy for the rest of today and you should NOT DRIVE or use heavy machinery until tomorrow (because of the sedation medicines used during the test).    FOLLOW UP: Our staff will call the number listed on your records the next business day following your procedure to check on you and address any questions or concerns that you may have regarding the information given to you following your procedure. If we do not reach you, we will leave a message.  However, if you are feeling well and you are not experiencing any problems, there is no need to return our call.  We will assume that you have returned to your regular daily activities without incident.  If any biopsies were taken you will be contacted by phone or by letter within the next 1-3 weeks.  Please call us at [phone] if you have not heard about the biopsies in 3 weeks.    SIGNATURES/CONFIDENTIALITY: You and/or your care partner have signed paperwork which will be entered into your electronic medical record.  These signatures attest to the fact that that the information above on your After Visit Summary has been reviewed and is understood.  Full responsibility of the confidentiality of this discharge information lies with you and/or your care-partner.

## 2018-09-02 ENCOUNTER — Telehealth: Payer: Self-pay | Admitting: *Deleted

## 2018-09-02 ENCOUNTER — Telehealth: Payer: Self-pay

## 2018-09-02 NOTE — Telephone Encounter (Signed)
Left message on f/u callback 

## 2018-09-02 NOTE — Telephone Encounter (Signed)
  Follow up Call-  Call back number 09/01/2018  Post procedure Call Back phone  # [phone]  Permission to leave phone message Yes  Some recent data might be hidden     Patient questions:  Do you have a fever, pain , or abdominal swelling? No. Pain Score  0 *  Have you tolerated food without any problems? Yes.    Have you been able to return to your normal activities? Yes.    Do you have any questions about your discharge instructions: Diet   No. Medications  No. Follow up visit  No.  Do you have questions or concerns about your Care? No.  Actions: * If pain score is 4 or above: No action needed, pain <4.

## 2023-06-25 ENCOUNTER — Encounter: Payer: Self-pay | Admitting: Gastroenterology

## 2023-08-12 ENCOUNTER — Ambulatory Visit (AMBULATORY_SURGERY_CENTER): Payer: 59

## 2023-08-12 VITALS — Ht 78.0 in | Wt 230.0 lb

## 2023-08-12 DIAGNOSIS — Z85048 Personal history of other malignant neoplasm of rectum, rectosigmoid junction, and anus: Secondary | ICD-10-CM

## 2023-08-12 MED ORDER — NA SULFATE-K SULFATE-MG SULF 17.5-3.13-1.6 GM/177ML PO SOLN: 1.0000 | Freq: Once | ORAL | 0 refills | Status: AC

## 2023-08-12 NOTE — Progress Notes (Signed)
 No egg or soy allergy known to patient  No issues known to pt with past sedation with any surgeries or procedures Patient denies ever being told they had issues or difficulty with intubation  No FH of Malignant Hyperthermia Pt is not on diet pills Pt is not on  home 02  Pt is not on blood thinners  Pt with int constipation and diarrhea s/p colon resection  No A fib or A flutter Have any cardiac testing pending-- no  LOA: independent  Prep: suprep    Patient's chart reviewed by Norleen Schillings CNRA prior to previsit and patient appropriate for the [facility].  Previsit completed and red dot placed by patient's name on their procedure day (on provider's schedule).     PV competed with patient. Prep instructions sent via mychart and home address. Goodrx coupon for [employer] provided to use for price reduction if needed.

## 2023-09-03 ENCOUNTER — Encounter: Payer: Self-pay | Admitting: Gastroenterology

## 2023-09-09 ENCOUNTER — Encounter: Payer: Self-pay | Admitting: Gastroenterology

## 2023-09-09 ENCOUNTER — Ambulatory Visit: Payer: 59 | Admitting: Gastroenterology

## 2023-09-09 VITALS — BP 142/92 | HR 72 | Temp 98.8°F | Resp 15 | Ht 78.0 in | Wt 230.0 lb

## 2023-09-09 DIAGNOSIS — Z980 Intestinal bypass and anastomosis status: Secondary | ICD-10-CM

## 2023-09-09 DIAGNOSIS — D122 Benign neoplasm of ascending colon: Secondary | ICD-10-CM

## 2023-09-09 DIAGNOSIS — K648 Other hemorrhoids: Secondary | ICD-10-CM

## 2023-09-09 DIAGNOSIS — Z85048 Personal history of other malignant neoplasm of rectum, rectosigmoid junction, and anus: Secondary | ICD-10-CM | POA: Diagnosis not present

## 2023-09-09 DIAGNOSIS — D123 Benign neoplasm of transverse colon: Secondary | ICD-10-CM

## 2023-09-09 DIAGNOSIS — Z1211 Encounter for screening for malignant neoplasm of colon: Secondary | ICD-10-CM

## 2023-09-09 DIAGNOSIS — K5730 Diverticulosis of large intestine without perforation or abscess without bleeding: Secondary | ICD-10-CM | POA: Diagnosis not present

## 2023-09-09 MED ORDER — SODIUM CHLORIDE 0.9 % IV SOLN: 500.0000 mL | Freq: Once | INTRAVENOUS | Status: DC

## 2023-09-09 NOTE — Progress Notes (Signed)
 Called to room to assist during endoscopic procedure.  Patient ID and intended procedure confirmed with present staff. Received instructions for my participation in the procedure from the performing physician.

## 2023-09-09 NOTE — Progress Notes (Signed)
 [facility] History and Physical   Primary Care Physician:  Charlott Dorn LABOR, MD   Reason for Procedure:   History of rectal cancer  Plan:    colonoscopy     HPI: [name] [name] is a 63 y.o. male  here for colonoscopy surveillance - history of rectal cancer 2015 s/p surgery. Last exam 2020 with Dr. Teressa. No new changes in his bowels.  No family history of colon cancer known. Otherwise feels well without any cardiopulmonary symptoms.   I have discussed risks / benefits of anesthesia and endoscopic procedure with [name] [name] and they wish to proceed with the exams as outlined today.    Past Medical History:  Diagnosis Date   Arthritis    back L4L5 S1 -press siatic nerve    Cancer (HCC)    rectal cancer - polyp found on routine colonoscopy.   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 Authorizing Provider  acetaminophen  (TYLENOL ) 325 MG tablet Take 650 mg by mouth every 6 (six) hours as needed.   Yes [provider]  Cholecalciferol (VITAMIN D) 2000 UNITS CAPS Take 1 capsule by mouth every morning. Reported on 08/01/2015 not taken in 1 year    [provider]  famotidine  (PEPCID ) 20 MG tablet Take 20 mg by mouth daily as needed. 2-3 times a week    [provider]  fluticasone (FLONASE) 50 MCG/ACT nasal spray Place into the nose. Patient not taking: Reported on 08/12/2023 07/21/18 07/21/19  [provider]  ibuprofen  (ADVIL ,MOTRIN ) 400 MG tablet  Take 1-2 tablets (400-800 mg total) by mouth every 6 (six) hours as needed for mild pain or moderate pain. 05/31/14   Thomas, Alicia, MD  sildenafil (REVATIO) 20 MG tablet Take 20 mg by mouth 3 (three) times daily as needed.    [provider]  tadalafil (ADCIRCA/CIALIS) 20 MG tablet Take 20 mg by mouth daily as needed for erectile dysfunction.    [provider]    Current Outpatient Medications  Medication Sig Dispense Refill   acetaminophen  (TYLENOL ) 325 MG tablet Take 650 mg by mouth every 6 (six) hours as needed.     Cholecalciferol (VITAMIN D) 2000 UNITS CAPS Take 1 capsule by mouth every morning. Reported on 08/01/2015 not taken in 1 year     famotidine  (PEPCID ) 20 MG tablet Take 20 mg by mouth daily as needed. 2-3 times a week     fluticasone (FLONASE) 50 MCG/ACT nasal spray Place into the nose. (Patient not taking: Reported on 08/12/2023)     ibuprofen  (ADVIL ,MOTRIN ) 400 MG tablet Take 1-2 tablets (400-800 mg total) by mouth every 6 (six) hours as needed for mild pain or moderate pain. 30 tablet 0   sildenafil (REVATIO) 20 MG tablet Take 20 mg by mouth 3 (three) times daily as needed.     tadalafil (ADCIRCA/CIALIS) 20  MG tablet Take 20 mg by mouth daily as needed for erectile dysfunction.     Current Facility-Administered Medications  Medication Dose Route Frequency Provider Last Rate Last Admin   0.9 %  sodium chloride  infusion  500 mL Intravenous Once [name] Toribio SQUIBB, MD       0.9 %  sodium chloride  infusion  500 mL Intravenous Once [name] [name], [name] SQUIBB, MD        Allergies as of 09/09/2023 - Review Complete 09/09/2023  Allergen Reaction Noted   Penicillins  03/22/2014    Family History  Problem Relation Age of Onset   Colon polyps Mother    Colon cancer Neg Hx    Esophageal cancer Neg Hx    Stomach cancer Neg Hx    Rectal cancer Neg Hx     Social History   Socioeconomic History   Marital status: Married    Spouse name: Not on file   Number of  children: Not on file   Years of education: Not on file   Highest education level: Not on file  Occupational History   Not on file  Tobacco Use   Smoking status: Former    Current packs/day: 0.00    Types: Cigarettes    Quit date: 08/06/1979    Years since quitting: 44.1   Smokeless tobacco: Never  Substance and Sexual Activity   Alcohol use: Yes    Alcohol/week: 7.0 standard drinks of alcohol    Types: 7 Cans of beer per week   Drug use: No   Sexual activity: Not on file  Other Topics Concern   Not on file  Social History Narrative   Not on file   Social Drivers of Health   Financial Resource Strain: Not on file  Food Insecurity: Not on file  Transportation Needs: Not on file  Physical Activity: Not on file  Stress: Not on file  Social Connections: Not on file  Intimate Partner Violence: Not on file    Review of Systems: All other review of systems negative except as mentioned in the HPI.  Physical Exam: Vital signs BP (!) 140/91   Pulse 85   Temp 98.8 F (37.1 C)   Ht 6' 6 (1.981 m)   Wt 230 lb (104.3 kg)   SpO2 98%   BMI 26.58 kg/m   General:   Alert,  Well-developed, pleasant and cooperative in NAD Lungs:  Clear throughout to auscultation.   Heart:  Regular rate and rhythm Abdomen:  Soft, nontender and nondistended.   Neuro/Psych:  Alert and cooperative. Normal mood and affect. A and O x 3  Marcey Naval, MD [facility]

## 2023-09-09 NOTE — Patient Instructions (Signed)

## 2023-09-09 NOTE — Op Note (Signed)
 [facility] Patient Name: [name] [name] Procedure Date: 09/09/2023 8:58 AM MRN: [id number] Endoscopist: Elspeth P. Leigh , MD, [id number] Age: 63 Referring MD:  Date of Birth: Jul 07, 1961 Gender: Male Account #: [id number] Procedure:                Colonoscopy Indications:              High risk colon cancer surveillance: Personal                            history of rectal cancer - s/p surgical resection                            in 2015, last exam 08/2018 - Dr. Teressa Medicines:                Monitored Anesthesia Care Procedure:                Pre-Anesthesia Assessment:                           - Prior to the procedure, a History and Physical                            was performed, and patient medications and                            allergies were reviewed. The patient's tolerance of                            previous anesthesia was also reviewed. The risks                            and benefits of the procedure and the sedation                            options and risks were discussed with the patient.                            All questions were answered, and informed consent                            was obtained. Prior Anticoagulants: The patient has                            taken no anticoagulant or antiplatelet agents. ASA                            Grade Assessment: II - A patient with mild systemic                            disease. After reviewing the risks and benefits,                            the patient was deemed in satisfactory condition to  undergo the procedure.                           After obtaining informed consent, the colonoscope                            was passed under direct vision. Throughout the                            procedure, the patient's blood pressure, pulse, and                            oxygen saturations were monitored continuously. The                            Colonoscope was  introduced through the anus and                            advanced to the the cecum, identified by                            appendiceal orifice and ileocecal valve. The                            colonoscopy was performed without difficulty. The                            patient tolerated the procedure well. The quality                            of the bowel preparation was adequate. The                            ileocecal valve, appendiceal orifice, and rectum                            were photographed. Scope In: 9:02:47 AM Scope Out: 9:26:28 AM Scope Withdrawal Time: 0 hours 15 minutes 46 seconds  Total Procedure Duration: 0 hours 23 minutes 41 seconds  Findings:                 The perianal and digital rectal examinations were                            normal.                           A 3 mm polyp was found in the ascending colon. The                            polyp was sessile. The polyp was removed with a                            cold snare. Resection and retrieval were complete.  Five sessile polyps were found in the transverse                            colon. The polyps were 3 to 5 mm in size. These                            polyps were removed with a cold snare. Resection                            and retrieval were complete.                           A few small-mouthed diverticula were found in the                            entire colon.                           There was evidence of a prior end-to-end                            colo-colonic anastomosis in the proximal rectum.                            This was patent and was characterized by healthy                            appearing mucosa.                           Internal hemorrhoids were found during retroflexion.                           The colon was long and redundant with looping in                            the right colon. Abdominal pressure used to achieve                             cecal intubation. The exam was otherwise without                            abnormality. Complications:            No immediate complications. Estimated blood loss:                            Minimal. Estimated Blood Loss:     Estimated blood loss was minimal. Impression:               - One 3 mm polyp in the ascending colon, removed                            with a cold snare. Resected and retrieved.                           -  Five 3 to 5 mm polyps in the transverse colon,                            removed with a cold snare. Resected and retrieved.                           - A few diverticuli in the entire examined colon.                           - Patent end-to-end colo-colonic anastomosis,                            characterized by healthy appearing mucosa.                           - Internal hemorrhoids.                           - Long and redundant colon.                           - The examination was otherwise normal. Recommendation:           - Patient has a contact number available for                            emergencies. The signs and symptoms of potential                            delayed complications were discussed with the                            patient. Return to normal activities tomorrow.                            Written discharge instructions were provided to the                            patient.                           - Resume previous diet.                           - Continue present medications.                           - Await pathology results. Anticipate repeat                            colonoscopy in 3 years given burden of polyps                            removed today. Elspeth P. Mabel Roll, MD 09/09/2023 9:32:51 AM This report has been signed electronically.

## 2023-09-09 NOTE — Progress Notes (Signed)
 Sedate, gd SR, tolerated procedure well, VSS, report to RN

## 2023-09-10 ENCOUNTER — Telehealth: Payer: Self-pay | Admitting: *Deleted

## 2023-09-10 NOTE — Telephone Encounter (Signed)
  Follow up Call-     09/09/2023    8:15 AM  Call back number  Post procedure Call Back phone  # [phone]  Permission to leave phone message Yes    Post procedure follow up phone call. No answer at number given.  Left message on voicemail.

## 2023-09-11 ENCOUNTER — Encounter: Payer: Self-pay | Admitting: Gastroenterology

## 2023-09-11 LAB — SURGICAL PATHOLOGY
